# Patient Record
Sex: Female | Born: 2000 | Race: Black or African American | Hispanic: No | Marital: Single | Smoking: Never smoker
Health system: Southern US, Community
[De-identification: ages and names within clinical notes are randomized; demographics above are authoritative.]

## PROBLEM LIST (undated history)

## (undated) DIAGNOSIS — R011 Cardiac murmur, unspecified: Secondary | ICD-10-CM

## (undated) HISTORY — PX: WISDOM TOOTH EXTRACTION: SHX21

## (undated) HISTORY — PX: NO PAST SURGERIES: SHX2092

---
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
## 2020-02-09 ENCOUNTER — Emergency Department (HOSPITAL_COMMUNITY)
Admission: EM | Admit: 2020-02-09 | Discharge: 2020-02-09 | Disposition: A | Discharge status: Home / Self Care | Payer: Medicaid Other | Attending: Emergency Medicine | Admitting: Emergency Medicine

## 2020-02-09 ENCOUNTER — Encounter (HOSPITAL_COMMUNITY): Payer: Self-pay | Admitting: Emergency Medicine

## 2020-02-09 ENCOUNTER — Other Ambulatory Visit: Payer: Self-pay

## 2020-02-09 DIAGNOSIS — J029 Acute pharyngitis, unspecified: Secondary | ICD-10-CM | POA: Insufficient documentation

## 2020-02-09 DIAGNOSIS — Z79899 Other long term (current) drug therapy: Secondary | ICD-10-CM | POA: Diagnosis not present

## 2020-02-09 LAB — GROUP A STREP BY PCR: Group A Strep by PCR: NOT DETECTED

## 2020-02-09 MED ORDER — CHLORHEXIDINE GLUCONATE 0.12 % MT SOLN: 15.0000 mL | Freq: Two times a day (BID) | OROMUCOSAL | 0 refills | Status: DC

## 2020-02-09 MED ORDER — IBUPROFEN 600 MG PO TABS: 600.0000 mg | ORAL_TABLET | Freq: Four times a day (QID) | ORAL | 0 refills | Status: DC | PRN

## 2020-02-09 NOTE — ED Triage Notes (Signed)
Pt c/o sore throat since this morning. Denies fever.

## 2020-02-09 NOTE — Discharge Instructions (Signed)
Your strep test is negative.  You are likely having a viral pharyngitis. Try using a cool-mist vaporizer/humidifer/steam from hot showers, limit talking, OTC throat lozenges and mouthwashes qd, gargling w/ warm saltwater, advancement of fluids as tolerated, nasal saline sprays, rest, OTC acetaminophen or ibuprofen as directed prn for pain control, frequent handwashing, and boiling/disposing of contaminated toothbrushes.

## 2020-02-09 NOTE — ED Notes (Signed)
Pt verbalized understanding of d/c instructions, follow up care, s/s requiring return to ED and prescriptions. Pt had no further questions at this time.

## 2020-02-09 NOTE — ED Provider Notes (Signed)
[facility] EMERGENCY DEPARTMENT Provider Note   CSN: [id number] Arrival date & time: 02/09/20  1854     History Chief Complaint  Patient presents with  . Sore Throat    Rebecca Bean is a 19 y.o. female.  The history is provided by the patient. No language interpreter was used.     19 year old female presenting for evaluation of sore throat.  Patient reports since this morning she has been experiencing throat irritation.  She endorsed increasing pain with swallowing.  Symptom is mild to moderate in severity.  No associated fever chills no runny nose sneezing coughing chest pain shortness of breath abdominal cramping loss of taste or loss of smell.  No recent Covid exposure or sick contact.  No specific treatment tried.  Denies any significant neck pain.  Past Medical History:  Diagnosis Date  . Heart murmur     There are no problems to display for this patient.   History reviewed. No pertinent surgical history.   OB History    Gravida  1   Para  0   Term  0   Preterm  0   AB  0   Living        SAB  0   TAB  0   Ectopic  0   Multiple      Live Births              No family history on file.  Social History   Tobacco Use  . Smoking status: Never Smoker  . Smokeless tobacco: Never Used  Substance Use Topics  . Alcohol use: No  . Drug use: No    Home Medications Prior to Admission medications   Medication Sig Start Date End Date Taking? Authorizing Provider  diphenhydrAMINE (BENADRYL) 25 mg capsule Take 1 capsule (25 mg total) by mouth every 6 (six) hours as needed for itching. 03/04/17   Gwyneth Sprout, MD  hydrocortisone 1 % ointment Apply 1 application topically 2 (two) times daily. 12/14/15   Charlynne Pander, MD  ibuprofen (ADVIL,MOTRIN) 600 MG tablet Take 1 tablet (600 mg total) by mouth every 6 (six) hours as needed. 08/14/14   Ree Shay, MD    Allergies    Patient has no known allergies.  Review of Systems    Review of Systems  Constitutional: Negative for fever.  HENT: Positive for sore throat. Negative for dental problem, rhinorrhea and trouble swallowing.   Respiratory: Negative for shortness of breath.     Physical Exam Updated Vital Signs BP 125/76 (BP Location: Left Arm)   Pulse (!) 102   Temp 98.9 F (37.2 C) (Oral)   Resp 17   SpO2 100%   Physical Exam Vitals and nursing note reviewed.  Constitutional:      General: She is not in acute distress.    Appearance: She is well-developed.     Comments: Well-appearing, normal phonation, no drooling  HENT:     Head: Atraumatic.     Right Ear: Tympanic membrane normal.     Left Ear: Tympanic membrane normal.     Mouth/Throat:     Pharynx: Uvula midline. No uvula swelling.     Tonsils: Tonsillar exudate present. No tonsillar abscesses. 2+ on the right. 2+ on the left.  Eyes:     Conjunctiva/sclera: Conjunctivae normal.  Cardiovascular:     Rate and Rhythm: Normal rate and regular rhythm.  Pulmonary:     Effort: Pulmonary effort is normal.  Breath sounds: Normal breath sounds. No wheezing.  Abdominal:     Palpations: Abdomen is soft.     Tenderness: There is no abdominal tenderness.  Musculoskeletal:     Cervical back: Neck supple.  Skin:    Findings: No rash.  Neurological:     Mental Status: She is alert.     ED Results / Procedures / Treatments   Labs (all labs ordered are listed, but only abnormal results are displayed) Labs Reviewed  GROUP A STREP BY PCR    EKG None  Radiology No results found.  Procedures Procedures (including critical care time)  Medications Ordered in ED Medications - No data to display  ED Course  I have reviewed the triage vital signs and the nursing notes.  Pertinent labs & imaging results that were available during my care of the patient were reviewed by me and considered in my medical decision making (see chart for details).    MDM Rules/Calculators/A&P                       BP 125/76 (BP Location: Left Arm)   Pulse (!) 102   Temp 98.9 F (37.2 C) (Oral)   Resp 17   SpO2 100%   Final Clinical Impression(s) / ED Diagnoses Final diagnoses:  Viral pharyngitis    Rx / DC Orders ED Discharge Orders         Ordered    chlorhexidine (PERIDEX) 0.12 % solution  2 times daily     02/09/20 2112    ibuprofen (ADVIL) 600 MG tablet  Every 6 hours PRN     02/09/20 2112         Patient presenting with sore throat consistent with viral pharyngitis.  1 out of 4 Centor criteria.  The patient did not have trismus, hot potato voice, uvula deviation, unilateral tonsillar swelling, toxic appearance, drooling or pain with movement of the trachea to suggest peritonsillar abscess or epiglottitis.  No evidence of bacterial infections including peritonsillar abscess, retropharyngeal abscess, epiglottitis.  Rapid strep was obtained and was negative decreasing the likelihood of bacterial pharyngitis (A rapid strep test has a 95% sensitivity and a specificity of 98%).  Patient advised to continue ibuprofen and Tylenol at home.  Impression: Viral Pharyngitis Sore Throat  Advised Pt on supportive therapies, including using a cool-mist vaporizer/humidifer/steam from hot showers, limit talking, OTC throat lozenges and mouthwashes qd, gargling w/ warm saltwater, advancement of fluids as tolerated, nasal saline sprays, rest, OTC acetaminophen or ibuprofen as directed prn for pain control, frequent handwashing, and boiling/disposing of contaminated toothbrushes.   * Instructed Pt to f/up w/ PCP or ER should Sx worsen or not improve. Pt verbally expressed understanding and all questions were addressed to Pt's satisfaction.   * Informed to return to ER if has new or worsening symptoms.    Domenic Moras, PA-C 02/09/20 2113    Carmin Muskrat, MD 02/10/20 2350

## 2020-02-10 ENCOUNTER — Ambulatory Visit (INDEPENDENT_AMBULATORY_CARE_PROVIDER_SITE_OTHER): Payer: Medicaid Other | Admitting: *Deleted

## 2020-02-10 DIAGNOSIS — Z3200 Encounter for pregnancy test, result unknown: Secondary | ICD-10-CM

## 2020-02-10 DIAGNOSIS — Z98891 History of uterine scar from previous surgery: Secondary | ICD-10-CM | POA: Insufficient documentation

## 2020-02-10 DIAGNOSIS — Z3401 Encounter for supervision of normal first pregnancy, first trimester: Secondary | ICD-10-CM

## 2020-02-10 DIAGNOSIS — Z3201 Encounter for pregnancy test, result positive: Secondary | ICD-10-CM | POA: Diagnosis not present

## 2020-02-10 DIAGNOSIS — Z3400 Encounter for supervision of normal first pregnancy, unspecified trimester: Secondary | ICD-10-CM | POA: Insufficient documentation

## 2020-02-10 LAB — POCT URINE PREGNANCY: Preg Test, Ur: POSITIVE — AB

## 2020-02-10 NOTE — Progress Notes (Signed)
Rebecca Bean presents today for UPT. She has no unusual complaints.  LMP:12/17/2019 EDD: 09/22/2020    OBJECTIVE: Appears well, in no apparent distress.  OB History    Gravida  1   Para  0   Term  0   Preterm  0   AB  0   Living        SAB  0   TAB  0   Ectopic  0   Multiple      Live Births             Home UPT Result: Positive  In-Office UPT result:Positive  I have reviewed the patient's medical, obstetrical, social, and family histories, and medications.   ASSESSMENT: Positive pregnancy test  PLAN Prenatal care to be completed at: [facility] NOB intake completed today.

## 2020-02-10 NOTE — Progress Notes (Signed)
Patient seen and assessed by nursing staff during this encounter. I have reviewed the chart and agree with the documentation and plan.  Jaynie Collins, MD 02/10/2020 9:15 AM

## 2020-03-09 ENCOUNTER — Other Ambulatory Visit: Payer: Self-pay

## 2020-03-09 MED ORDER — BLOOD PRESSURE KIT DEVI: 1.0000 | 0 refills | Status: DC

## 2020-03-10 ENCOUNTER — Other Ambulatory Visit: Payer: Self-pay

## 2020-03-10 ENCOUNTER — Encounter: Payer: Self-pay | Admitting: Nurse Practitioner

## 2020-03-10 ENCOUNTER — Ambulatory Visit (INDEPENDENT_AMBULATORY_CARE_PROVIDER_SITE_OTHER): Payer: Medicaid Other | Admitting: Nurse Practitioner

## 2020-03-10 ENCOUNTER — Other Ambulatory Visit (HOSPITAL_COMMUNITY)
Admission: RE | Admit: 2020-03-10 | Discharge: 2020-03-10 | Disposition: A | Discharge status: Home / Self Care | Payer: Medicaid Other | Source: Ambulatory Visit | Attending: Nurse Practitioner | Admitting: Nurse Practitioner

## 2020-03-10 VITALS — BP 116/70 | HR 84 | Wt 172.6 lb

## 2020-03-10 DIAGNOSIS — Z3401 Encounter for supervision of normal first pregnancy, first trimester: Secondary | ICD-10-CM

## 2020-03-10 DIAGNOSIS — Z3A12 12 weeks gestation of pregnancy: Secondary | ICD-10-CM

## 2020-03-10 DIAGNOSIS — Z3400 Encounter for supervision of normal first pregnancy, unspecified trimester: Secondary | ICD-10-CM | POA: Diagnosis not present

## 2020-03-10 DIAGNOSIS — O210 Mild hyperemesis gravidarum: Secondary | ICD-10-CM

## 2020-03-10 MED ORDER — DOXYLAMINE-PYRIDOXINE 10-10 MG PO TBEC: DELAYED_RELEASE_TABLET | ORAL | 2 refills | Status: DC

## 2020-03-10 NOTE — Patient Instructions (Addendum)
Morning Sickness  Morning sickness is when you feel sick to your stomach (nauseous) during pregnancy. You may feel sick to your stomach and throw up (vomit). You may feel sick in the morning, but you can feel this way at any time of day. Some women feel very sick to their stomach and cannot stop throwing up (hyperemesis gravidarum). Follow these instructions at home: Medicines  Take over-the-counter and prescription medicines only as told by your doctor. Do not take any medicines until you talk with your doctor about them first.  Taking multivitamins before getting pregnant can stop or lessen the harshness of morning sickness. Eating and drinking  Eat dry toast or crackers before getting out of bed.  Eat 5 or 6 small meals a day.  Eat dry and bland foods like rice and baked potatoes.  Do not eat greasy, fatty, or spicy foods.  Have someone cook for you if the smell of food causes you to feel sick or throw up.  If you feel sick to your stomach after taking prenatal vitamins, take them at night or with a snack.  Eat protein when you need a snack. Nuts, yogurt, and cheese are good choices.  Drink fluids throughout the day.  Try ginger ale made with real ginger, ginger tea made from fresh grated ginger, or ginger candies. General instructions  Do not use any products that have nicotine or tobacco in them, such as cigarettes and e-cigarettes. If you need help quitting, ask your doctor.  Use an air purifier to keep the air in your house free of smells.  Get lots of fresh air.  Try to avoid smells that make you feel sick.  Try: ? Wearing a bracelet that is used for seasickness (acupressure wristband). ? Going to a doctor who puts thin needles into certain body points (acupuncture) to improve how you feel. Contact a doctor if:  You need medicine to feel better.  You feel dizzy or light-headed.  You are losing weight. Get help right away if:  You feel very sick to your  stomach and cannot stop throwing up.  You pass out (faint).  You have very bad pain in your belly. Summary  Morning sickness is when you feel sick to your stomach (nauseous) during pregnancy.  You may feel sick in the morning, but you can feel this way at any time of day.  Making some changes to what you eat may help your symptoms go away. This information is not intended to replace advice given to you by your health care provider. Make sure you discuss any questions you have with your health care provider. Document Revised: 10/20/2017 Document Reviewed: 12/08/2016 Elsevier Patient Education  2020 [employer] First Trimester of Pregnancy  The first trimester of pregnancy is from week 1 until the end of week 13 (months 1 through 3). During this time, your baby will begin to develop inside you. At 6-8 weeks, the eyes and face are formed, and the heartbeat can be seen on ultrasound. At the end of 12 weeks, all the baby's organs are formed. Prenatal care is all the medical care you receive before the birth of your baby. Make sure you get good prenatal care and follow all of your doctor's instructions. Follow these instructions at home: Medicines  Take over-the-counter and prescription medicines only as told by your doctor. Some medicines are safe and some medicines are not safe during pregnancy.  Take a prenatal vitamin that contains at least 600 micrograms (mcg) of   folic acid.  If you have trouble pooping (constipation), take medicine that will make your stool soft (stool softener) if your doctor approves. Eating and drinking   Eat regular, healthy meals.  Your doctor will tell you the amount of weight gain that is right for you.  Avoid raw meat and uncooked cheese.  If you feel sick to your stomach (nauseous) or throw up (vomit): ? Eat 4 or 5 small meals a day instead of 3 large meals. ? Try eating a few soda crackers. ? Drink liquids between meals instead of during  meals.  To prevent constipation: ? Eat foods that are high in fiber, like fresh fruits and vegetables, whole grains, and beans. ? Drink enough fluids to keep your pee (urine) clear or pale yellow. Activity  Exercise only as told by your doctor. Stop exercising if you have cramps or pain in your lower belly (abdomen) or low back.  Do not exercise if it is too hot, too humid, or if you are in a place of great height (high altitude).  Try to avoid standing for long periods of time. Move your legs often if you must stand in one place for a long time.  Avoid heavy lifting.  Wear low-heeled shoes. Sit and stand up straight.  You can have sex unless your doctor tells you not to. Relieving pain and discomfort  Wear a good support bra if your breasts are sore.  Take warm water baths (sitz baths) to soothe pain or discomfort caused by hemorrhoids. Use hemorrhoid cream if your doctor says it is okay.  Rest with your legs raised if you have leg cramps or low back pain.  If you have puffy, bulging veins (varicose veins) in your legs: ? Wear support hose or compression stockings as told by your doctor. ? Raise (elevate) your feet for 15 minutes, 3-4 times a day. ? Limit salt in your food. Prenatal care  Schedule your prenatal visits by the twelfth week of pregnancy.  Write down your questions. Take them to your prenatal visits.  Keep all your prenatal visits as told by your doctor. This is important. Safety  Wear your seat belt at all times when driving.  Make a list of emergency phone numbers. The list should include numbers for family, friends, the hospital, and police and fire departments. General instructions  Ask your doctor for a referral to a local prenatal class. Begin classes no later than at the start of month 6 of your pregnancy.  Ask for help if you need counseling or if you need help with nutrition. Your doctor can give you advice or tell you where to go for help.  Do  not use hot tubs, steam rooms, or saunas.  Do not douche or use tampons or scented sanitary pads.  Do not cross your legs for long periods of time.  Avoid all herbs and alcohol. Avoid drugs that are not approved by your doctor.  Do not use any tobacco products, including cigarettes, chewing tobacco, and electronic cigarettes. If you need help quitting, ask your doctor. You may get counseling or other support to help you quit.  Avoid cat litter boxes and soil used by cats. These carry germs that can cause birth defects in the baby and can cause a loss of your baby (miscarriage) or stillbirth.  Visit your dentist. At home, brush your teeth with a soft toothbrush. Be gentle when you floss. Contact a doctor if:  You are dizzy.  You have mild   cramps or pressure in your lower belly.  You have a nagging pain in your belly area.  You continue to feel sick to your stomach, you throw up, or you have watery poop (diarrhea).  You have a bad smelling fluid coming from your vagina.  You have pain when you pee (urinate).  You have increased puffiness (swelling) in your face, hands, legs, or ankles. Get help right away if:  You have a fever.  You are leaking fluid from your vagina.  You have spotting or bleeding from your vagina.  You have very bad belly cramping or pain.  You gain or lose weight rapidly.  You throw up blood. It may look like coffee grounds.  You are around people who have German measles, fifth disease, or chickenpox.  You have a very bad headache.  You have shortness of breath.  You have any kind of trauma, such as from a fall or a car accident. Summary  The first trimester of pregnancy is from week 1 until the end of week 13 (months 1 through 3).  To take care of yourself and your unborn baby, you will need to eat healthy meals, take medicines only if your doctor tells you to do so, and do activities that are safe for you and your baby.  Keep all follow-up  visits as told by your doctor. This is important as your doctor will have to ensure that your baby is healthy and growing well. This information is not intended to replace advice given to you by your health care provider. Make sure you discuss any questions you have with your health care provider. Document Revised: 02/28/2019 Document Reviewed: 11/15/2016 Elsevier Patient Education  2020 [employer]  

## 2020-03-10 NOTE — Progress Notes (Signed)
Pt presents for NOB visit c/o morning sickness. Babyscripts explained in detail

## 2020-03-10 NOTE — Progress Notes (Signed)
Subjective:   Rebecca Bean is a 19 y.o. G1P0000 at 77w5dby LMP being seen today for her first obstetrical visit.  Her obstetrical history is significant for age 19 nausea and vomiting this pregnancy. Patient does intend to breast feed. Pregnancy history fully reviewed.  Patient reports no complaints.  HISTORY: OB History  Gravida Para Term Preterm AB Living  1 0 0 0 0 0  SAB TAB Ectopic Multiple Live Births  0 0 0 0 0    # Outcome Date GA Lbr Len/2nd Weight Sex Delivery Anes PTL Lv  1 Current            Past Medical History:  Diagnosis Date  . Heart murmur    No past surgical history on file. Family History  Problem Relation Age of Onset  . Hypertension Maternal Grandmother    Social History   Tobacco Use  . Smoking status: Former SResearch scientist (life sciences) . Smokeless tobacco: Never Used  Substance Use Topics  . Alcohol use: No  . Drug use: No   No Known Allergies Current Outpatient Medications on File Prior to Visit  Medication Sig Dispense Refill  . Blood Pressure Monitoring (BLOOD PRESSURE KIT) DEVI 1 kit by Does not apply route once a week. Check Blood Pressure regularly and record readings into the Babyscripts App.  Large Cuff.  DX O90.0 1 each 0  . chlorhexidine (PERIDEX) 0.12 % solution Use as directed 15 mLs in the mouth or throat 2 (two) times daily. 120 mL 0  . hydrocortisone 1 % ointment Apply 1 application topically 2 (two) times daily. 30 g 0   No current facility-administered medications on file prior to visit.     Exam   Vitals:   03/10/20 1528  BP: 116/70  Pulse: 84  Weight: 172 lb 9.6 oz (78.3 kg)   Fetal Heart Rate (bpm): 164  Uterus:     Pelvic Exam: Perineum: no hemorrhoids, normal perineum   Vulva: normal external genitalia, no lesions   Vagina:  normal mucosa, normal discharge   Cervix: no lesions and normal, pap smear done.    Adnexa: normal adnexa and no mass, fullness, tenderness   Bony Pelvis: average  System: General: well-developed,  well-nourished female in no acute distress   Breast:  normal appearance, no masses or tenderness   Skin: normal coloration and turgor, no rashes   Neurologic: oriented, normal, negative, normal mood   Extremities: normal strength, tone, and muscle mass, ROM of all joints is normal   HEENT extraocular movement intact and sclera clear, anicteric   Mouth/Teeth mucous membranes moist, pharynx normal without lesions and dental hygiene good   Neck supple and no masses, normal thyroid   Cardiovascular: regular rate and rhythm   Respiratory:  no respiratory distress, normal breath sounds   Abdomen: soft, non-tender; no masses,  no organomegaly     Assessment:   Pregnancy: G1P0000 Patient Active Problem List   Diagnosis Date Noted  . Morning sickness 03/10/2020  . Supervision of normal first pregnancy 02/10/2020     Plan:  1. Supervision of normal first pregnancy, antepartum Revewed Babyscripts and MyChart To pick up BP cuff Reviewed to call the office and be seen by this practice for any problems in the pregnancy.  - Enroll Patient in Babyscripts - Babyscripts Schedule Optimization - Culture, OB Urine - Genetic Screening - Hepatitis C Antibody - Obstetric Panel, Including HIV - Cervicovaginal ancillary only( [place]) - [place]FM OB COMP + 14  WK; Future  2. Morning sickness Diclegis ordered and reviewed how to begin and take the medication.   Initial labs drawn. Continue prenatal vitamins. Genetic Screening discussed, NIPS: ordered. Ultrasound discussed; fetal anatomic survey: ordered. Problem list reviewed and updated. The nature of [place] [facility] with multiple MDs and other Advanced Practice Providers was explained to patient; also emphasized that residents, students are part of our team. Routine obstetric precautions reviewed. Return in about 4 weeks (around 04/07/2020) for virtual ROB.  Total face-to-face time with patient: 40 minutes.   Over 50% of encounter was spent on counseling and coordination of care.     Earlie Server, FNP Family Nurse Practitioner, [facility] for [employer], [place] [facility] 03/10/2020 4:07 PM

## 2020-03-11 ENCOUNTER — Encounter: Payer: Self-pay | Admitting: Nurse Practitioner

## 2020-03-11 DIAGNOSIS — A568 Sexually transmitted chlamydial infection of other sites: Secondary | ICD-10-CM | POA: Insufficient documentation

## 2020-03-11 DIAGNOSIS — O98211 Gonorrhea complicating pregnancy, first trimester: Secondary | ICD-10-CM | POA: Insufficient documentation

## 2020-03-11 LAB — CERVICOVAGINAL ANCILLARY ONLY
Bacterial Vaginitis (gardnerella): POSITIVE — AB
Candida Glabrata: NEGATIVE
Candida Vaginitis: POSITIVE — AB
Chlamydia: POSITIVE — AB
Comment: NEGATIVE
Comment: NEGATIVE
Comment: NEGATIVE
Comment: NEGATIVE
Comment: NEGATIVE
Comment: NORMAL
Neisseria Gonorrhea: POSITIVE — AB
Trichomonas: NEGATIVE

## 2020-03-11 LAB — OBSTETRIC PANEL, INCLUDING HIV
Antibody Screen: NEGATIVE
Basophils Absolute: 0 10*3/uL (ref 0.0–0.2)
Basos: 0 %
EOS (ABSOLUTE): 0.2 10*3/uL (ref 0.0–0.4)
Eos: 2 %
HIV Screen 4th Generation wRfx: NONREACTIVE
Hematocrit: 34.8 % (ref 34.0–46.6)
Hemoglobin: 11.1 g/dL (ref 11.1–15.9)
Hepatitis B Surface Ag: NEGATIVE
Immature Grans (Abs): 0 10*3/uL (ref 0.0–0.1)
Immature Granulocytes: 0 %
Lymphocytes Absolute: 2.9 10*3/uL (ref 0.7–3.1)
Lymphs: 27 %
MCH: 24.3 pg — ABNORMAL LOW (ref 26.6–33.0)
MCHC: 31.9 g/dL (ref 31.5–35.7)
MCV: 76 fL — ABNORMAL LOW (ref 79–97)
Monocytes Absolute: 0.9 10*3/uL (ref 0.1–0.9)
Monocytes: 8 %
Neutrophils Absolute: 6.7 10*3/uL (ref 1.4–7.0)
Neutrophils: 63 %
Platelets: 266 10*3/uL (ref 150–450)
RBC: 4.56 x10E6/uL (ref 3.77–5.28)
RDW: 15.4 % (ref 11.7–15.4)
RPR Ser Ql: NONREACTIVE
Rh Factor: POSITIVE
Rubella Antibodies, IGG: 11.1 index (ref >0.99)
WBC: 10.8 10*3/uL (ref 3.4–10.8)

## 2020-03-11 LAB — HEPATITIS C ANTIBODY: Hep C Virus Ab: <0.1 s/co ratio (ref 0.0–0.9)

## 2020-03-12 ENCOUNTER — Other Ambulatory Visit: Payer: Self-pay

## 2020-03-12 ENCOUNTER — Ambulatory Visit (INDEPENDENT_AMBULATORY_CARE_PROVIDER_SITE_OTHER): Payer: Medicaid Other

## 2020-03-12 VITALS — BP 115/70 | HR 89 | Wt 173.0 lb

## 2020-03-12 DIAGNOSIS — Z3A00 Weeks of gestation of pregnancy not specified: Secondary | ICD-10-CM

## 2020-03-12 DIAGNOSIS — O98311 Other infections with a predominantly sexual mode of transmission complicating pregnancy, first trimester: Secondary | ICD-10-CM | POA: Diagnosis not present

## 2020-03-12 DIAGNOSIS — A568 Sexually transmitted chlamydial infection of other sites: Secondary | ICD-10-CM | POA: Diagnosis not present

## 2020-03-12 DIAGNOSIS — O98211 Gonorrhea complicating pregnancy, first trimester: Secondary | ICD-10-CM

## 2020-03-12 MED ORDER — CEFTRIAXONE SODIUM 1 G IJ SOLR
1.0000 g | Freq: Once | INTRAMUSCULAR | Status: AC
  Administered 2020-03-12: 1 g via INTRAMUSCULAR

## 2020-03-12 MED ORDER — AZITHROMYCIN 500 MG PO TABS: 1000.0000 mg | ORAL_TABLET | Freq: Once | ORAL | 1 refills | Status: AC

## 2020-03-12 NOTE — Progress Notes (Signed)
Patient presents for Rocephin inj for positive GC. Inj given in RUOQ. Pt tolerated well. Advised patient to abstain from IC until she and her partner has completed treatment. Rx for Chlamydia has been sent to the pharmacy.  Ceftriaxone reconstituted with 2.1 ML 1% Lidocaine

## 2020-03-15 ENCOUNTER — Encounter: Payer: Self-pay | Admitting: Nurse Practitioner

## 2020-03-15 DIAGNOSIS — O99820 Streptococcus B carrier state complicating pregnancy: Secondary | ICD-10-CM | POA: Insufficient documentation

## 2020-03-15 LAB — URINE CULTURE, OB REFLEX

## 2020-03-15 LAB — CULTURE, OB URINE

## 2020-03-23 ENCOUNTER — Encounter: Payer: Self-pay | Admitting: Nurse Practitioner

## 2020-03-23 DIAGNOSIS — D563 Thalassemia minor: Secondary | ICD-10-CM | POA: Insufficient documentation

## 2020-03-24 ENCOUNTER — Other Ambulatory Visit: Payer: Medicaid Other

## 2020-03-24 ENCOUNTER — Other Ambulatory Visit: Payer: Self-pay

## 2020-03-26 ENCOUNTER — Encounter: Payer: Self-pay | Admitting: Nurse Practitioner

## 2020-04-01 ENCOUNTER — Encounter: Payer: Self-pay | Admitting: Obstetrics and Gynecology

## 2020-04-07 ENCOUNTER — Encounter: Payer: Self-pay | Admitting: Obstetrics

## 2020-04-07 ENCOUNTER — Telehealth (INDEPENDENT_AMBULATORY_CARE_PROVIDER_SITE_OTHER): Payer: Medicaid Other | Admitting: Obstetrics

## 2020-04-07 DIAGNOSIS — O99820 Streptococcus B carrier state complicating pregnancy: Secondary | ICD-10-CM

## 2020-04-07 DIAGNOSIS — Z3400 Encounter for supervision of normal first pregnancy, unspecified trimester: Secondary | ICD-10-CM

## 2020-04-07 DIAGNOSIS — O98212 Gonorrhea complicating pregnancy, second trimester: Secondary | ICD-10-CM

## 2020-04-07 DIAGNOSIS — O210 Mild hyperemesis gravidarum: Secondary | ICD-10-CM

## 2020-04-07 DIAGNOSIS — Z3A16 16 weeks gestation of pregnancy: Secondary | ICD-10-CM

## 2020-04-07 NOTE — Progress Notes (Signed)
   OBSTETRICS PRENATAL VIRTUAL VISIT ENCOUNTER NOTE  Provider location: Center for [facility] [place]   I connected with [name] on 04/07/20 at 11:15 AM EDT by MyChart Video Encounter at home and verified that I am speaking with the correct person using two identifiers.   I discussed the limitations, risks, security and privacy concerns of performing an evaluation and management service virtually and the availability of in person appointments. I also discussed with the patient that there may be a patient responsible charge related to this service. The patient expressed understanding and agreed to proceed. Subjective:  Rebecca Bean is a 19 y.o. G1P0000 at [redacted]w[redacted]d being seen today for ongoing prenatal care.  She is currently monitored for the following issues for this low-risk pregnancy and has Supervision of normal first pregnancy; Morning sickness; Gonorrhea in pregnancy, antepartum, first trimester; Chlamydia trachomatis infection in pregnancy in first trimester; Group B Streptococcus carrier, +RV culture, currently pregnant; and Alpha thalassemia silent carrier on their problem list.  Patient reports nausea.   .  .   . Denies any leaking of fluid.   The following portions of the patient's history were reviewed and updated as appropriate: allergies, current medications, past family history, past medical history, past social history, past surgical history and problem list.   Objective:  There were no vitals filed for this visit.  Fetal Status:           General:  Alert, oriented and cooperative. Patient is in no acute distress.  Respiratory: Normal respiratory effort, no problems with respiration noted  Mental Status: Normal mood and affect. Normal behavior. Normal judgment and thought content.  Rest of physical exam deferred due to type of encounter  Imaging: No results found.  Assessment and Plan:  Pregnancy: G1P0000 at [redacted]w[redacted]d 1. Supervision of normal first  pregnancy, antepartum  2. Morning sickness - Diclegis Rx along with dietary modifications   Preterm labor symptoms and general obstetric precautions including but not limited to vaginal bleeding, contractions, leaking of fluid and fetal movement were reviewed in detail with the patient. I discussed the assessment and treatment plan with the patient. The patient was provided an opportunity to ask questions and all were answered. The patient agreed with the plan and demonstrated an understanding of the instructions. The patient was advised to call back or seek an in-person office evaluation/go to [facility] at [facility] for any urgent or concerning symptoms. Please refer to After Visit Summary for other counseling recommendations.   I provided 10 minutes of face-to-face time during this encounter.  Return in about 4 weeks (around 05/05/2020) for MyChart.  Future Appointments  Date Time Provider Department Center  04/23/2020 10:45 AM [facility] US5 [facility]    Coral Ceo, MD Center for [facility], [facility] 04/07/2020

## 2020-04-22 ENCOUNTER — Encounter: Payer: Self-pay | Admitting: Nurse Practitioner

## 2020-04-23 ENCOUNTER — Other Ambulatory Visit: Payer: Self-pay | Admitting: Nurse Practitioner

## 2020-04-23 ENCOUNTER — Ambulatory Visit: Payer: Medicaid Other | Attending: Nurse Practitioner

## 2020-04-23 ENCOUNTER — Other Ambulatory Visit: Payer: Self-pay

## 2020-04-23 ENCOUNTER — Other Ambulatory Visit: Payer: Self-pay | Admitting: *Deleted

## 2020-04-23 DIAGNOSIS — Z3A16 16 weeks gestation of pregnancy: Secondary | ICD-10-CM | POA: Diagnosis not present

## 2020-04-23 DIAGNOSIS — Z3400 Encounter for supervision of normal first pregnancy, unspecified trimester: Secondary | ICD-10-CM

## 2020-04-23 DIAGNOSIS — Z363 Encounter for antenatal screening for malformations: Secondary | ICD-10-CM | POA: Diagnosis not present

## 2020-04-23 DIAGNOSIS — Z148 Genetic carrier of other disease: Secondary | ICD-10-CM | POA: Diagnosis not present

## 2020-04-23 DIAGNOSIS — O2692 Pregnancy related conditions, unspecified, second trimester: Secondary | ICD-10-CM

## 2020-04-23 DIAGNOSIS — Z362 Encounter for other antenatal screening follow-up: Secondary | ICD-10-CM

## 2020-05-05 ENCOUNTER — Telehealth (INDEPENDENT_AMBULATORY_CARE_PROVIDER_SITE_OTHER): Payer: Medicaid Other | Admitting: Certified Nurse Midwife

## 2020-05-05 DIAGNOSIS — Z3402 Encounter for supervision of normal first pregnancy, second trimester: Secondary | ICD-10-CM

## 2020-05-05 DIAGNOSIS — O98211 Gonorrhea complicating pregnancy, first trimester: Secondary | ICD-10-CM

## 2020-05-05 DIAGNOSIS — O98311 Other infections with a predominantly sexual mode of transmission complicating pregnancy, first trimester: Secondary | ICD-10-CM

## 2020-05-05 DIAGNOSIS — Z3A17 17 weeks gestation of pregnancy: Secondary | ICD-10-CM

## 2020-05-05 DIAGNOSIS — A568 Sexually transmitted chlamydial infection of other sites: Secondary | ICD-10-CM

## 2020-05-05 MED ORDER — VITAFOL GUMMIES 3.33-0.333-34.8 MG PO CHEW
3.0000 | CHEWABLE_TABLET | Freq: Every day | ORAL | 3 refills | Status: DC
Start: 2020-05-05 — End: 2020-10-18

## 2020-05-05 NOTE — Progress Notes (Signed)
Pt states she is having some occ cramping- would like to discuss.  Pt denies bleeding.   Pt needs PNV- please in Rx.   Pt is unable to check BP at time of intake - pt is driving.

## 2020-05-05 NOTE — Patient Instructions (Addendum)
Eating Plan for Pregnant Women While you are pregnant, your body requires additional nutrition to help support your growing baby. You also have a higher need for some vitamins and minerals, such as folic acid, calcium, iron, and vitamin D. Eating a healthy, well-balanced diet is very important for your health and your baby's health. Your need for extra calories varies for the three 80-monthsegments of your pregnancy (trimesters). For most women, it is recommended to consume:  150 extra calories a day during the first trimester.  300 extra calories a day during the second trimester.  300 extra calories a day during the third trimester. What are tips for following this plan?   Do not try to lose weight or go on a diet during pregnancy.  Limit your overall intake of foods that have "empty calories." These are foods that have little nutritional value, such as sweets, desserts, candies, and sugar-sweetened beverages.  Eat a variety of foods (especially fruits and vegetables) to get a full range of vitamins and minerals.  Take a prenatal vitamin to help meet your additional vitamin and mineral needs during pregnancy, specifically for folic acid, iron, calcium, and vitamin D.  Remember to stay active. Ask your health care provider what types of exercise and activities are safe for you.  Practice good food safety and cleanliness. Wash your hands before you eat and after you prepare raw meat. Wash all fruits and vegetables well before peeling or eating. Taking these actions can help to prevent food-borne illnesses that can be very dangerous to your baby, such as listeriosis. Ask your health care provider for more information about listeriosis. What does 150 extra calories look like? Healthy options that provide 150 extra calories each day could be any of the following:  6-8 oz (170-230 g) of plain low-fat yogurt with  cup of berries.  1 apple with 2 teaspoons (11 g) of peanut butter.  Cut-up  vegetables with  cup (60 g) of hummus.  8 oz (230 mL) or 1 cup of low-fat chocolate milk.  1 stick of string cheese with 1 medium orange.  1 peanut butter and jelly sandwich that is made with one slice of whole-wheat bread and 1 tsp (5 g) of peanut butter. For 300 extra calories, you could eat two of those healthy options each day. What is a healthy amount of weight to gain? The right amount of weight gain for you is based on your BMI before you became pregnant. If your BMI:  Was less than 18 (underweight), you should gain 28-40 lb (13-18 kg).  Was 18-24.9 (normal), you should gain 25-35 lb (11-16 kg).  Was 25-29.9 (overweight), you should gain 15-25 lb (7-11 kg).  Was 30 or greater (obese), you should gain 11-20 lb (5-9 kg). What if I am having twins or multiples? Generally, if you are carrying twins or multiples:  You may need to eat 300-600 extra calories a day.  The recommended range for total weight gain is 25-54 lb (11-25 kg), depending on your BMI before pregnancy.  Talk with your health care provider to find out about nutritional needs, weight gain, and exercise that is right for you. What foods can I eat?  Fruits All fruits. Eat a variety of colors and types of fruit. Remember to wash your fruits well before peeling or eating. Vegetables All vegetables. Eat a variety of colors and types of vegetables. Remember to wash your vegetables well before peeling or eating. Grains All grains. Choose whole grains, such  as whole-wheat bread, oatmeal, or brown rice. Meats and other protein foods Lean meats, including chicken, [place], fish, and lean cuts of beef, veal, or pork. If you eat fish or seafood, choose options that are higher in omega-3 fatty acids and lower in mercury, such as salmon, herring, mussels, trout, sardines, pollock, shrimp, crab, and lobster. Tofu. Tempeh. Beans. Eggs. Peanut butter and other nut butters. Make sure that all meats, poultry, and eggs are cooked to  food-safe temperatures or "well-done." Two or more servings of fish are recommended each week in order to get the most benefits from omega-3 fatty acids that are found in seafood. Choose fish that are lower in mercury. You can find more information online:  GuamGaming.ch Dairy Pasteurized milk and milk alternatives (such as almond milk). Pasteurized yogurt and pasteurized cheese. Cottage cheese. Sour cream. Beverages Water. Juices that contain 100% fruit juice or vegetable juice. Caffeine-free teas and decaffeinated coffee. Drinks that contain caffeine are okay to drink, but it is better to avoid caffeine. Keep your total caffeine intake to less than 200 mg each day (which is 12 oz or 355 mL of coffee, tea, or soda) or the limit as told by your health care provider. Fats and oils Fats and oils are okay to include in moderation. Sweets and desserts Sweets and desserts are okay to include in moderation. Seasoning and other foods All pasteurized condiments. The items listed above may not be a complete list of foods and beverages you can eat. Contact a dietitian for more information. What foods are not recommended? Fruits Unpasteurized fruit juices. Vegetables Raw (unpasteurized) vegetable juices. Meats and other protein foods Lunch meats, bologna, hot dogs, or other deli meats. (If you must eat those meats, reheat them until they are steaming hot.) Refrigerated pat, meat spreads from a meat counter, smoked seafood that is found in the refrigerated section of a store. Raw or undercooked meats, poultry, and eggs. Raw fish, such as sushi or sashimi. Fish that have high mercury content, such as tilefish, shark, swordfish, and king mackerel. To learn more about mercury in fish, talk with your health care provider or look for online resources, such as:  [employer]amGaming.ch Dairy Raw (unpasteurized) milk and any foods that have raw milk in them. Soft cheeses, such as feta, queso blanco, queso fresco, Brie,  Camembert cheeses, blue-veined cheeses, and Panela cheese (unless it is made with pasteurized milk, which must be stated on the label). Beverages Alcohol. Sugar-sweetened beverages, such as sodas, teas, or energy drinks. Seasoning and other foods Homemade fermented foods and drinks, such as pickles, sauerkraut, or kombucha drinks. (Store-bought pasteurized versions of these are okay.) Salads that are made in a store or deli, such as ham salad, chicken salad, egg salad, tuna salad, and seafood salad. The items listed above may not be a complete list of foods and beverages you should avoid. Contact a dietitian for more information. Where to find more information To calculate the number of calories you need based on your height, weight, and activity level, you can use an online calculator such as:  MobileTransition.ch To calculate how much weight you should gain during pregnancy, you can use an online pregnancy weight gain calculator such as:  StreamingFood.com.cy Summary  While you are pregnant, your body requires additional nutrition to help support your growing baby.  Eat a variety of foods, especially fruits and vegetables to get a full range of vitamins and minerals.  Practice good food safety and cleanliness. Wash your hands before you eat  and after you prepare raw meat. Wash all fruits and vegetables well before peeling or eating. Taking these actions can help to prevent food-borne illnesses, such as listeriosis, that can be very dangerous to your baby.  Do not eat raw meat or fish. Do not eat fish that have high mercury content, such as tilefish, shark, swordfish, and king mackerel. Do not eat unpasteurized (raw) dairy.  Take a prenatal vitamin to help meet your additional vitamin and mineral needs during pregnancy, specifically for folic acid, iron, calcium, and vitamin D. This information is not intended to replace advice given to you by  your health care provider. Make sure you discuss any questions you have with your health care provider. Document Revised: 03/28/2019 Document Reviewed: 08/04/2017 Elsevier Patient Education  [place] Test Why am I having this test? The alpha-fetoprotein test is most commonly used in pregnant women to help screen for birth defects in their unborn baby. It can be used to screen for birth defects, such as chromosome (DNA) abnormalities, problems with the brain or spinal cord, or problems with the abdominal wall of the unborn baby (fetus). The alpha-fetoprotein test may also be done for men or non-pregnant women to check for certain cancers. What is being tested? This test measures the amount of alpha-fetoprotein (AFP) in your blood. AFP is a protein that is made by the liver. Levels can be detected in the mother's blood during pregnancy, starting at 10 weeks and peaking at 16-18 weeks of the pregnancy. Abnormal levels can sometimes be a sign of a birth defect in the baby. Certain cancers can cause a high level of AFP in men and non-pregnant women. What kind of sample is taken?  A blood sample is required for this test. It is usually collected by inserting a needle into a blood vessel. How are the results reported? Your test results will be reported as values. Your health care provider will compare your results to normal ranges that were established after testing a large group of people (reference values). Reference values may vary among labs and hospitals. For this test, common reference values are:  Adult: Less than 40 ng/mL or less than 40 mcg/L (SI units).  Child younger than 1 year: Less than 30 ng/mL. If you are pregnant, the values may also vary based on how long you have been pregnant. What do the results mean? Results that are above the reference values in pregnant women may indicate the following for the baby:  Neural tube defects, such as abnormalities  of the spinal cord or brain.  Abdominal wall defects.  Multiple pregnancy such as twins.  Fetal distress or fetal death. Results that are above the reference values in men or non-pregnant women may indicate:  Reproductive cancers, such as ovarian or testicular cancer.  Liver cancer.  Liver cell death.  Other types of cancer. Very low levels of AFP in pregnant women may indicate the following for the baby:  Down syndrome.  Fetal death. Talk with your health care provider about what your results mean. Questions to ask your health care provider Ask your health care provider, or the department that is doing the test:  When will my results be ready?  How will I get my results?  What are my treatment options?  What other tests do I need?  What are my next steps? Summary  The alpha-fetoprotein test is done on pregnant women to help screen for birth defects in their unborn baby.  Certain cancers can cause a high level of AFP in men and non-pregnant women.  For this test, a blood sample is usually collected by inserting a needle into a blood vessel.  Talk with your health care provider about what your results mean. This information is not intended to replace advice given to you by your health care provider. Make sure you discuss any questions you have with your health care provider. Document Revised: 10/20/2017 Document Reviewed: 06/13/2017 Elsevier Patient Education  2020 [employer].

## 2020-05-05 NOTE — Progress Notes (Signed)
OBSTETRICS PRENATAL VIRTUAL VISIT ENCOUNTER NOTE  Provider location: [facility] [place]   I connected with Rebecca Bean on 05/05/20 at  2:02 PM EDT by MyChart Video Encounter at home and verified that I am speaking with the correct person using two identifiers.   I discussed the limitations, risks, security and privacy concerns of performing an evaluation and management service virtually and the availability of in person appointments. I also discussed with the patient that there may be a patient responsible charge related to this service. The patient expressed understanding and agreed to proceed. Subjective:  Rebecca Bean is a 19 y.o. G1P0000 at [redacted]w[redacted]d being seen today for ongoing prenatal care.  She is currently monitored for the following issues for this low-risk pregnancy and has Supervision of normal first pregnancy; Morning sickness; Gonorrhea in pregnancy, antepartum, first trimester; Chlamydia trachomatis infection in pregnancy in first trimester; Group B Streptococcus carrier, +RV culture, currently pregnant; and Alpha thalassemia silent carrier on their problem list.  Patient reports abdominal cramping.  Contractions: Irritability. Vag. Bleeding: None.  Movement: Absent. Denies any leaking of fluid.   The following portions of the patient's history were reviewed and updated as appropriate: allergies, current medications, past family history, past medical history, past social history, past surgical history and problem list.   Objective:  There were no vitals filed for this visit.  Fetal Status:     Movement: Absent     General:  Alert, oriented and cooperative. Patient is in no acute distress.  Respiratory: Normal respiratory effort, no problems with respiration noted  Mental Status: Normal mood and affect. Normal behavior. Normal judgment and thought content.  Rest of physical exam deferred due to type of encounter  Imaging: [place] MFM OB DETAIL +14  WK  Result Date: 04/23/2020 ----------------------------------------------------------------------  OBSTETRICS REPORT                       (Signed Final 04/23/2020 01:47 pm) ---------------------------------------------------------------------- Patient Info  ID #:       [id number]                          D.O.B.:  Feb 19, 2001 (19 yrs)  Name:       Rebecca Bean                Visit Date: 04/23/2020 11:10 am ---------------------------------------------------------------------- Performed By  Attending:        Noralee Space MD        Ref. Address:     [address]                                                             [phone]  [place]                                                             [id number]  Performed By:     Reinaldo Raddle            [facility]  Referred By:      [facility] ---------------------------------------------------------------------- Orders  #  Description                           Code        Ordered By  1  [place] MFM OB DETAIL +14 WK               76811.01    Nolene Bernheim ----------------------------------------------------------------------  #  Order #                     Accession #                Episode #  1  [id number]                   [id number]                 [id number] ---------------------------------------------------------------------- Indications  [redacted] weeks gestation of pregnancy                Z3A.16  Genetic carrier Scientist, research (medical))                   Z14.8  Encounter for antenatal screening for          Z36.3  malformations (LR NIPS)  Medical complication of pregnancy (heart       O26.90  murmor) ---------------------------------------------------------------------- Fetal Evaluation  Num Of Fetuses:         1  Fetal Heart Rate(bpm):  158  Cardiac Activity:        Observed  Presentation:           Breech  Placenta:               Anterior  P. Cord Insertion:      Visualized, central  Amniotic Fluid  AFI FV:      Within normal limits                              Largest Pocket(cm)                              4.3 ---------------------------------------------------------------------- Biometry  BPD:      33.4  mm     G. Age:  16w 3d         58  %    CI:        75.82   %  70 - 86                                                          FL/HC:      16.1   %    13.3 - 16.5  HC:      121.6  mm     G. Age:  16w 0d         32  %    HC/AC:      1.22        1.05 - 1.39  AC:       99.5  mm     G. Age:  16w 0d         46  %    FL/BPD:     58.7   %  FL:       19.6  mm     G. Age:  15w 6d         32  %    FL/AC:      19.7   %    20 - 24  HUM:        19  mm     G. Age:  15w 4d         36  %  CER:      15.8  mm     G. Age:  15w 6d         42  %  NFT:       3.2  mm  LV:        6.9  mm  CM:        2.7  mm  Est. FW:     140  gm      0 lb 5 oz     29  % ---------------------------------------------------------------------- OB History  Blood Type:   O+  Gravidity:    1         Term:   0        Prem:   0        SAB:   0  TOP:          0       Ectopic:  0        Living: 0 ---------------------------------------------------------------------- Gestational Age  LMP:           19w 0d        Date:  12/12/19                 EDD:   09/17/20  U/S Today:     16w 1d                                        EDD:   10/07/20  Best:          16w 1d     Det. By:  U/S (04/23/20)           EDD:   10/07/20 ---------------------------------------------------------------------- Anatomy  Cranium:               Appears normal         Aortic Arch:            Not well visualized  Cavum:                 Appears normal         Ductal Arch:            Not well visualized  Ventricles:            Appears normal         Diaphragm:              Not well visualized  Choroid Plexus:        Appears normal         Stomach:                 Appears normal, left                                                                        sided  Cerebellum:            Appears normal         Abdomen:                Appears normal  Posterior Fossa:       Appears normal         Abdominal Wall:         Appears nml (cord                                                                        insert, abd wall)  Nuchal Fold:           Appears normal         Cord Vessels:           Appears normal (3                                                                        vessel cord)  Face:                  Appears normal         Kidneys:                Appear normal                         (orbits and profile)  Lips:                  Not well visualized    Bladder:                Appears normal  Thoracic:              Appears normal         Spine:  Normal transverse                                                                        views  Heart:                 Echogenic focus        Upper Extremities:      Visualized                         in LV  RVOT:                  Not well visualized    Lower Extremities:      Visualized  LVOT:                  Not well visualized  Other:  Fetus appears to be a female. Nasal bone visualized. Heels visualized.          Technically difficult due to early gestational age. Technically difficult          due to fetal position. ---------------------------------------------------------------------- Cervix Uterus Adnexa  Cervix  Length:           3.47  cm.  Normal appearance by transabdominal scan.  Uterus  No abnormality visualized.  Right Ovary  Within normal limits. No adnexal mass visualized.  Left Ovary  Within normal limits. No adnexal mass visualized.  Cul De Sac  No free fluid seen.  Adnexa  No abnormality visualized. ---------------------------------------------------------------------- Impression  We performed fetal anatomy scan. An echogenic intracardiac  focus is seen. No other makers of aneuploidies or  fetal  structural defects are seen. Fetal biometry is consistent with  16w 1d gestation. Amniotic fluid is normal and good fetal  activity is seen.  We have assigned her EDD at 10/07/2020.  On cell-free fetal DNA screening, the risks of fetal  aneuploidies are not increased .  I informed the patient that given that she had low rik for fetal  aneuploidies on cell-free fetal DNA screening, finding of  echogenic intracardiac focus should be considered a normal  variant and that the risk of trisomy 21 is not increased. I also  reassured that echogenic focus does not increase the risk of  cardiac defects. I also informed her that only amniocentesis  will give a defintive result on the fetal karyotype.  Patient opted not to have amniocentesis. ---------------------------------------------------------------------- Recommendations  -An appointment was made for her to return in 4 weeks for  completion of fetal anatomy (cardiac anatomy and sagittal  view of spine) ----------------------------------------------------------------------                  Tama High, MD Electronically Signed Final Report   04/23/2020 01:47 pm ----------------------------------------------------------------------   Assessment and Plan:  Pregnancy: G1P0000 at [redacted]w[redacted]d 1. Encounter for supervision of normal first pregnancy in second trimester - Patient doing well, reports occasional abdominal cramping  - Patient reports drinking 1 cup of water a day if any. Reports that she has not drank water or ate at all today. Discussed with patient importance of eating and hydration throughout pregnancy, discussed with patient need for 6-8 cups of water per day and at least 2 meals  with snack.  - Eating plan for pregnant women on AVS, patient verbalizes understanding   - Routine prenatal care - anticipatory guidance on upcoming appointment including next being in person to obtain AFP and TOC.  - Prenatal Vit-Fe Phos-FA-Omega (VITAFOL GUMMIES)  3.33-0.333-34.8 MG CHEW; Chew 3 tablets by mouth daily.  Dispense: 90 tablet; Refill: 3  2. Chlamydia trachomatis infection in pregnancy in first trimester - needs TOC at next visit   3. Gonorrhea in pregnancy, antepartum, first trimester - needs TOC at next visit    - patient reports drinking 1cup to no cups per day   Preterm labor symptoms and general obstetric precautions including but not limited to vaginal bleeding, contractions, leaking of fluid and fetal movement were reviewed in detail with the patient. I discussed the assessment and treatment plan with the patient. The patient was provided an opportunity to ask questions and all were answered. The patient agreed with the plan and demonstrated an understanding of the instructions. The patient was advised to call back or seek an in-person office evaluation/[facility] for any urgent or concerning symptoms. Please refer to After Visit Summary for other counseling recommendations.   I provided 9 minutes of face-to-face time during this encounter.  Return in about 4 weeks (around 06/02/2020) for ROB/AFP/TOC.  Future Appointments  Date Time Provider Department Center  05/21/2020 11:15 AM [facility]  05/21/2020 11:15 AM [facility] US2 [facility]    Sharyon CableVeronica C Michie Molnar, CNM [facility]

## 2020-05-13 ENCOUNTER — Telehealth: Payer: Self-pay | Admitting: Licensed Clinical Social Worker

## 2020-05-13 NOTE — Telephone Encounter (Signed)
Subjective: Rebecca Bean is a G1P0000 at [redacted]w[redacted]d completed contraception counseling.  She does not have a history of any mental health concerns. She is not currently sexually active. She was previously using BC patch for birth control. Patient states mother and extended family as her support system.  Birth Control History:  patch  MDM Patient counseled on all options for birth control today including LARC. Patient desires patch initiated for birth control   Assessment:  19 y.o. female considering patch for birth control  Plan: no further plan   Rebecca Bean, Rebecca Bean 05/13/2020 2:42 PM

## 2020-05-21 ENCOUNTER — Other Ambulatory Visit: Payer: Self-pay

## 2020-05-21 ENCOUNTER — Ambulatory Visit: Payer: Medicaid Other | Admitting: *Deleted

## 2020-05-21 ENCOUNTER — Ambulatory Visit: Payer: Medicaid Other | Attending: Obstetrics and Gynecology

## 2020-05-21 VITALS — BP 102/48 | HR 74

## 2020-05-21 DIAGNOSIS — Z3A20 20 weeks gestation of pregnancy: Secondary | ICD-10-CM

## 2020-05-21 DIAGNOSIS — O283 Abnormal ultrasonic finding on antenatal screening of mother: Secondary | ICD-10-CM

## 2020-05-21 DIAGNOSIS — Z362 Encounter for other antenatal screening follow-up: Secondary | ICD-10-CM | POA: Diagnosis not present

## 2020-06-02 ENCOUNTER — Encounter: Payer: Self-pay | Admitting: Obstetrics

## 2020-06-02 ENCOUNTER — Other Ambulatory Visit: Payer: Self-pay

## 2020-06-02 ENCOUNTER — Ambulatory Visit (INDEPENDENT_AMBULATORY_CARE_PROVIDER_SITE_OTHER): Payer: Medicaid Other | Admitting: Obstetrics and Gynecology

## 2020-06-02 VITALS — BP 93/57 | HR 80 | Wt 169.0 lb

## 2020-06-02 DIAGNOSIS — O99820 Streptococcus B carrier state complicating pregnancy: Secondary | ICD-10-CM

## 2020-06-02 DIAGNOSIS — O98311 Other infections with a predominantly sexual mode of transmission complicating pregnancy, first trimester: Secondary | ICD-10-CM

## 2020-06-02 DIAGNOSIS — A568 Sexually transmitted chlamydial infection of other sites: Secondary | ICD-10-CM

## 2020-06-02 DIAGNOSIS — Z3402 Encounter for supervision of normal first pregnancy, second trimester: Secondary | ICD-10-CM

## 2020-06-02 DIAGNOSIS — O98312 Other infections with a predominantly sexual mode of transmission complicating pregnancy, second trimester: Secondary | ICD-10-CM

## 2020-06-02 DIAGNOSIS — O219 Vomiting of pregnancy, unspecified: Secondary | ICD-10-CM

## 2020-06-02 DIAGNOSIS — D563 Thalassemia minor: Secondary | ICD-10-CM

## 2020-06-02 DIAGNOSIS — Z3A21 21 weeks gestation of pregnancy: Secondary | ICD-10-CM

## 2020-06-02 MED ORDER — ONDANSETRON 4 MG PO TBDP: 4.0000 mg | ORAL_TABLET | Freq: Four times a day (QID) | ORAL | 0 refills | Status: DC | PRN

## 2020-06-02 MED ORDER — AZITHROMYCIN 500 MG PO TABS: 1000.0000 mg | ORAL_TABLET | Freq: Once | ORAL | 0 refills | Status: AC

## 2020-06-02 NOTE — Progress Notes (Signed)
   PRENATAL VISIT NOTE  Subjective:  Rebecca Bean is a 19 y.o. G1P0000 at [redacted]w[redacted]d being seen today for ongoing prenatal care.  She is currently monitored for the following issues for this low-risk pregnancy and has Supervision of normal first pregnancy; Morning sickness; Gonorrhea in pregnancy, antepartum, first trimester; Chlamydia trachomatis infection in pregnancy in first trimester; Group B Streptococcus carrier, +RV culture, currently pregnant; and Alpha thalassemia silent carrier on their problem list.  Patient doing well with no acute concerns today. She reports backache.  Contractions: Not present. Vag. Bleeding: None.  Movement: Present. Denies leaking of fluid.    No TOC performed today.  Pt did not finish her azithromycin adequately and continues to have unprotected sex with her untreated partner.  Pt advised no further intercourse until partner is treated and can show proff of his TOC.  The following portions of the patient's history were reviewed and updated as appropriate: allergies, current medications, past family history, past medical history, past social history, past surgical history and problem list. Problem list updated.  Objective:   Vitals:   06/02/20 1345  BP: (!) 93/57  Pulse: 80  Weight: 169 lb (76.7 kg)    Fetal Status: Fetal Heart Rate (bpm): 159   Movement: Present     General:  Alert, oriented and cooperative. Patient is in no acute distress.  Skin: Skin is warm and dry. No rash noted.   Cardiovascular: Normal heart rate noted  Respiratory: Normal respiratory effort, no problems with respiration noted  Abdomen: Soft, gravid, appropriate for gestational age.  Pain/Pressure: Absent     Pelvic: Cervical exam deferred        Extremities: Normal range of motion.  Edema: None  Mental Status:  Normal mood and affect. Normal behavior. Normal judgment and thought content.   Assessment and Plan:  Pregnancy: G1P0000 at [redacted]w[redacted]d  1. Encounter for supervision of  normal first pregnancy in second trimester  - AFP, Serum, Open Spina Bifida  2. Group B Streptococcus carrier, +RV culture, currently pregnant Pt aware of findings and treatment needed during labor  3. Alpha thalassemia silent carrier   4. Nausea and vomiting during pregnancy rx given so pt can tolerate med for STD - ondansetron (ZOFRAN ODT) 4 MG disintegrating tablet; Take 1 tablet (4 mg total) by mouth every 6 (six) hours as needed for nausea.  Dispense: 4 tablet; Refill: 0  5. Chlamydia trachomatis infection in mother during second trimester of pregnancy  - azithromycin (ZITHROMAX) 500 MG tablet; Take 2 tablets (1,000 mg total) by mouth once for 1 dose.  Dispense: 2 tablet; Refill: 0   Preterm labor symptoms and general obstetric precautions including but not limited to vaginal bleeding, contractions, leaking of fluid and fetal movement were reviewed in detail with the patient.  Please refer to After Visit Summary for other counseling recommendations.   Return in about 4 weeks (around 06/30/2020) for ROB.   Mariel Aloe, MD

## 2020-06-02 NOTE — Progress Notes (Signed)
ROB/AFP.  Reports no problems today. Patient reported throwing up the medications and did not inform us, she has been having sex.

## 2020-06-02 NOTE — Patient Instructions (Addendum)
Second Trimester of Pregnancy  The second trimester is from week 14 through week 27 (month 4 through 6). This is often the time in pregnancy that you feel your best. Often times, morning sickness has lessened or quit. You may have more energy, and you may get hungry more often. Your unborn baby is growing rapidly. At the end of the sixth month, he or she is about 9 inches long and weighs about 1 pounds. You will likely feel the baby move between 18 and 20 weeks of pregnancy. Follow these instructions at home: Medicines  Take over-the-counter and prescription medicines only as told by your doctor. Some medicines are safe and some medicines are not safe during pregnancy.  Take a prenatal vitamin that contains at least 600 micrograms (mcg) of folic acid.  If you have trouble pooping (constipation), take medicine that will make your stool soft (stool softener) if your doctor approves. Eating and drinking   Eat regular, healthy meals.  Avoid raw meat and uncooked cheese.  If you get low calcium from the food you eat, talk to your doctor about taking a daily calcium supplement.  Avoid foods that are high in fat and sugars, such as fried and sweet foods.  If you feel sick to your stomach (nauseous) or throw up (vomit): ? Eat 4 or 5 small meals a day instead of 3 large meals. ? Try eating a few soda crackers. ? Drink liquids between meals instead of during meals.  To prevent constipation: ? Eat foods that are high in fiber, like fresh fruits and vegetables, whole grains, and beans. ? Drink enough fluids to keep your pee (urine) clear or pale yellow. Activity  Exercise only as told by your doctor. Stop exercising if you start to have cramps.  Do not exercise if it is too hot, too humid, or if you are in a place of great height (high altitude).  Avoid heavy lifting.  Wear low-heeled shoes. Sit and stand up straight.  You can continue to have sex unless your doctor tells you not  to. Relieving pain and discomfort  Wear a good support bra if your breasts are tender.  Take warm water baths (sitz baths) to soothe pain or discomfort caused by hemorrhoids. Use hemorrhoid cream if your doctor approves.  Rest with your legs raised if you have leg cramps or low back pain.  If you develop puffy, bulging veins (varicose veins) in your legs: ? Wear support hose or compression stockings as told by your doctor. ? Raise (elevate) your feet for 15 minutes, 3-4 times a day. ? Limit salt in your food. Prenatal care  Write down your questions. Take them to your prenatal visits.  Keep all your prenatal visits as told by your doctor. This is important. Safety  Wear your seat belt when driving.  Make a list of emergency phone numbers, including numbers for family, friends, the hospital, and police and fire departments. General instructions  Ask your doctor about the right foods to eat or for help finding a counselor, if you need these services.  Ask your doctor about local prenatal classes. Begin classes before month 6 of your pregnancy.  Do not use hot tubs, steam rooms, or saunas.  Do not douche or use tampons or scented sanitary pads.  Do not cross your legs for long periods of time.  Visit your dentist if you have not done so. Use a soft toothbrush to brush your teeth. Floss gently.  Avoid all smoking, herbs,   and alcohol. Avoid drugs that are not approved by your doctor.  Do not use any products that contain nicotine or tobacco, such as cigarettes and e-cigarettes. If you need help quitting, ask your doctor.  Avoid cat litter boxes and soil used by cats. These carry germs that can cause birth defects in the baby and can cause a loss of your baby (miscarriage) or stillbirth. Contact a doctor if:  You have mild cramps or pressure in your lower belly.  You have pain when you pee (urinate).  You have bad smelling fluid coming from your vagina.  You continue to  feel sick to your stomach (nauseous), throw up (vomit), or have watery poop (diarrhea).  You have a nagging pain in your belly area.  You feel dizzy. Get help right away if:  You have a fever.  You are leaking fluid from your vagina.  You have spotting or bleeding from your vagina.  You have severe belly cramping or pain.  You lose or gain weight rapidly.  You have trouble catching your breath and have chest pain.  You notice sudden or extreme puffiness (swelling) of your face, hands, ankles, feet, or legs.  You have not felt the baby move in over an hour.  You have severe headaches that do not go away when you take medicine.  You have trouble seeing. Summary  The second trimester is from week 14 through week 27 (months 4 through 6). This is often the time in pregnancy that you feel your best.  To take care of yourself and your unborn baby, you will need to eat healthy meals, take medicines only if your doctor tells you to do so, and do activities that are safe for you and your baby.  Call your doctor if you get sick or if you notice anything unusual about your pregnancy. Also, call your doctor if you need help with the right food to eat, or if you want to know what activities are safe for you. This information is not intended to replace advice given to you by your health care provider. Make sure you discuss any questions you have with your health care provider. Document Revised: 03/01/2019 Document Reviewed: 12/13/2016 Elsevier Patient Education  2020 [employer] Inc.  Chlamydia, Female  Chlamydia is a STD (sexually transmitted disease). This is an infection that spreads through sexual contact. If it is not treated, it can cause serious problems. It must be treated with antibiotic medicine. If this infection is not treated and you are pregnant or become pregnant, your baby could get it during delivery. This may cause bad health problems for the baby. Sometimes, you may not  have symptoms (asymptomatic). When you have symptoms, they can include:  Burning when you pee (urinate).  Peeing often.  Fluid (discharge) coming from the vagina.  Redness, soreness, and swelling (inflammation) of the butt (rectum).  Bleeding or fluid coming from the butt.  Belly (abdominal) pain.  Pain during sex.  Bleeding between periods.  Itching, burning, or redness in the eyes.  Fluid coming from the eyes. Follow these instructions at home: Medicines  Take over-the-counter and prescription medicines only as told by your doctor.  Take your antibiotic medicine as told by your doctor. Do not stop taking the antibiotic even if you start to feel better. Sexual activity  Tell sex partners about your infection. Sex partners are people you had oral, anal, or vaginal sex with within 60 days of when you started getting sick. They need treatment,  too.  Do not have sex until: ? You and your sex partners have been treated. ? Your doctor says it is okay.  If you have a single dose treatment, wait 7 days before having sex. General instructions  It is up to you to get your test results. Ask your doctor when your results will be ready.  Get a lot of rest.  Eat healthy foods.  Drink enough fluid to keep your pee (urine) clear or pale yellow.  Keep all follow-up visits as told by your doctor. You may need tests after 3 months. Preventing chlamydia  The only way to prevent chlamydia is not to have sex. To lower your risk: ? Use latex condoms correctly. Do this every time you have sex. ? Avoid having many sex partners. ? Ask if your partner has been tested for STDs and if he or she had negative results. Contact a doctor if:  You get new symptoms.  You do not get better with treatment.  You have a fever or chills.  You have pain during sex. Get help right away if:  Your pain gets worse and does not get better with medicine.  You get flu-like symptoms, such  as: ? Night sweats. ? Sore throat. ? Muscle aches.  You feel sick to your stomach (nauseous).  You throw up (vomit).  You have trouble swallowing.  You have bleeding: ? Between periods. ? After sex.  You have irregular periods.  You have belly pain that does not get better with medicine.  You have lower back pain that does not get better with medicine.  You feel weak or dizzy.  You pass out (faint).  You are pregnant and you get symptoms of chlamydia. Summary  Chlamydia is an infection that spreads through sexual contact.  Sometimes, chlamydia can cause no symptoms (asymptomatic).  Do not have sex until your doctor says it is okay.  All sex partners will have to be treated for chlamydia. This information is not intended to replace advice given to you by your health care provider. Make sure you discuss any questions you have with your health care provider. Document Revised: 05/01/2018 Document Reviewed: 10/27/2016 Elsevier Patient Education  2020 [employer].

## 2020-06-04 LAB — AFP, SERUM, OPEN SPINA BIFIDA
AFP MoM: 1.35
AFP Value: 98.4 ng/mL
Gest. Age on Collection Date: 21.6 weeks
Maternal Age At EDD: 19.1 yr
OSBR Risk 1 IN: 8303
Test Results:: NEGATIVE
Weight: 169 [lb_av]

## 2020-06-19 ENCOUNTER — Ambulatory Visit (HOSPITAL_COMMUNITY)
Admission: EM | Admit: 2020-06-19 | Discharge: 2020-06-19 | Disposition: A | Discharge status: Home / Self Care | Payer: Medicaid Other | Attending: Urgent Care | Admitting: Urgent Care

## 2020-06-19 ENCOUNTER — Inpatient Hospital Stay (HOSPITAL_COMMUNITY)
Admission: AD | Admit: 2020-06-19 | Discharge: 2020-06-19 | Disposition: A | Discharge status: Home / Self Care | Payer: Medicaid Other | Attending: Obstetrics and Gynecology | Admitting: Obstetrics and Gynecology

## 2020-06-19 ENCOUNTER — Ambulatory Visit (HOSPITAL_COMMUNITY): Payer: Self-pay

## 2020-06-19 ENCOUNTER — Encounter (HOSPITAL_COMMUNITY): Payer: Self-pay | Admitting: Obstetrics and Gynecology

## 2020-06-19 ENCOUNTER — Encounter (HOSPITAL_COMMUNITY): Payer: Self-pay

## 2020-06-19 ENCOUNTER — Other Ambulatory Visit: Payer: Self-pay

## 2020-06-19 DIAGNOSIS — Z3A27 27 weeks gestation of pregnancy: Secondary | ICD-10-CM | POA: Diagnosis present

## 2020-06-19 DIAGNOSIS — Z79899 Other long term (current) drug therapy: Secondary | ICD-10-CM | POA: Diagnosis not present

## 2020-06-19 DIAGNOSIS — N898 Other specified noninflammatory disorders of vagina: Secondary | ICD-10-CM

## 2020-06-19 DIAGNOSIS — O23592 Infection of other part of genital tract in pregnancy, second trimester: Secondary | ICD-10-CM | POA: Diagnosis not present

## 2020-06-19 DIAGNOSIS — Z3A24 24 weeks gestation of pregnancy: Secondary | ICD-10-CM

## 2020-06-19 DIAGNOSIS — O99891 Other specified diseases and conditions complicating pregnancy: Secondary | ICD-10-CM | POA: Diagnosis not present

## 2020-06-19 DIAGNOSIS — B9689 Other specified bacterial agents as the cause of diseases classified elsewhere: Secondary | ICD-10-CM | POA: Diagnosis not present

## 2020-06-19 DIAGNOSIS — O98812 Other maternal infectious and parasitic diseases complicating pregnancy, second trimester: Secondary | ICD-10-CM | POA: Insufficient documentation

## 2020-06-19 DIAGNOSIS — B3731 Acute candidiasis of vulva and vagina: Secondary | ICD-10-CM

## 2020-06-19 DIAGNOSIS — R1030 Lower abdominal pain, unspecified: Secondary | ICD-10-CM | POA: Insufficient documentation

## 2020-06-19 DIAGNOSIS — Z3402 Encounter for supervision of normal first pregnancy, second trimester: Secondary | ICD-10-CM

## 2020-06-19 DIAGNOSIS — B373 Candidiasis of vulva and vagina: Secondary | ICD-10-CM | POA: Diagnosis not present

## 2020-06-19 DIAGNOSIS — Z113 Encounter for screening for infections with a predominantly sexual mode of transmission: Secondary | ICD-10-CM | POA: Insufficient documentation

## 2020-06-19 DIAGNOSIS — Z87891 Personal history of nicotine dependence: Secondary | ICD-10-CM | POA: Diagnosis not present

## 2020-06-19 DIAGNOSIS — N760 Acute vaginitis: Secondary | ICD-10-CM

## 2020-06-19 LAB — WET PREP, GENITAL
Sperm: NONE SEEN
Trich, Wet Prep: NONE SEEN

## 2020-06-19 MED ORDER — TERCONAZOLE 0.4 % VA CREA: 1.0000 | TOPICAL_CREAM | Freq: Every day | VAGINAL | 0 refills | Status: DC

## 2020-06-19 MED ORDER — METRONIDAZOLE 500 MG PO TABS: 500.0000 mg | ORAL_TABLET | Freq: Two times a day (BID) | ORAL | 0 refills | Status: DC

## 2020-06-19 MED ORDER — TERCONAZOLE 0.4 % VA CREA: 1.0000 | TOPICAL_CREAM | Freq: Every day | VAGINAL | 0 refills | Status: AC

## 2020-06-19 NOTE — Discharge Instructions (Signed)
Please go to the [facility] for further evaluation of your abdominal pain

## 2020-06-19 NOTE — MAU Provider Note (Signed)
History     CSN: [id number]  Arrival date and time: 06/19/20 2026   First Provider Initiated Contact with Patient 06/19/20 2123      Chief Complaint  Patient presents with  . Vaginal Discharge  . Abdominal Cramping   Ms. Rebecca Bean is a 19 y.o. year old G62P0000 female at 21w2dweeks gestation who presents to MAU reporting sharp lower abdominal pain since May when she stands up at work and the pain radiates through to her lower back, increased white vaginal discharge since the beginning pregnancy and she feels "tingling" in her vagina.  She reports her last sexual intercourse was 2 weeks ago.  She denies vaginal bleeding.  She reports positive fetal movement.  [facility] last appointment was this month.   OB History    Gravida  1   Para  0   Term  0   Preterm  0   AB  0   Living        SAB  0   TAB  0   Ectopic  0   Multiple      Live Births              Past Medical History:  Diagnosis Date  . Heart murmur     Past Surgical History:  Procedure Laterality Date  . NO PAST SURGERIES      Family History  Problem Relation Age of Onset  . Hypertension Maternal Grandmother     Social History   Tobacco Use  . Smoking status: Former SResearch scientist (life sciences) . Smokeless tobacco: Never Used  Vaping Use  . Vaping Use: Never used  Substance Use Topics  . Alcohol use: No  . Drug use: No    Allergies: No Known Allergies  Medications Prior to Admission  Medication Sig Dispense Refill Last Dose  . Blood Pressure Monitoring (BLOOD PRESSURE KIT) DEVI 1 kit by Does not apply route once a week. Check Blood Pressure regularly and record readings into the Babyscripts App.  Large Cuff.  DX O90.0 1 each 0   . chlorhexidine (PERIDEX) 0.12 % solution Use as directed 15 mLs in the mouth or throat 2 (two) times daily. (Patient not taking: Reported on 05/21/2020) 120 mL 0   . Doxylamine-Pyridoxine (DICLEGIS) 10-10 MG TBEC Take 2 tablets at bedtime  and one in the morning and one in the afternoon as needed for nausea. (Patient not taking: Reported on 05/05/2020) 60 tablet 2   . hydrocortisone 1 % ointment Apply 1 application topically 2 (two) times daily. (Patient not taking: Reported on 05/21/2020) 30 g 0   . ondansetron (ZOFRAN ODT) 4 MG disintegrating tablet Take 1 tablet (4 mg total) by mouth every 6 (six) hours as needed for nausea. 4 tablet 0   . Prenatal Vit-Fe Phos-FA-Omega (VITAFOL GUMMIES) 3.33-0.333-34.8 MG CHEW Chew 3 tablets by mouth daily. 90 tablet 3     Review of Systems  Constitutional: Negative.   HENT: Negative.   Eyes: Negative.   Respiratory: Negative.   Cardiovascular: Negative.   Gastrointestinal: Negative.   Endocrine: Negative.   Genitourinary: Positive for vaginal discharge (increased throughout pregnancy) and vaginal pain ("tingling" in vagina).  Musculoskeletal: Negative.   Skin: Negative.   Allergic/Immunologic: Negative.   Neurological: Negative.   Hematological: Negative.   Psychiatric/Behavioral: Negative.    Physical Exam   Blood pressure (!) 115/61, pulse 87, temperature 98.6 F (37 C), temperature source Oral, resp. rate 17, last menstrual period  12/12/2019, SpO2 99 %.  Physical Exam Vitals and nursing note reviewed. Exam conducted with a chaperone present.  Constitutional:      Appearance: Normal appearance. She is normal weight.  HENT:     Head: Normocephalic and atraumatic.  Cardiovascular:     Rate and Rhythm: Normal rate.  Pulmonary:     Effort: Pulmonary effort is normal.  Genitourinary:    General: Normal vulva.     Vagina: Vaginal discharge (increased, white) present.     Comments: Uterus: gravid, S=D, SE: cervix is smooth, pink, no lesions, small amt of thick, white vaginal d/c -- WP, GC/CT done, closed/long/firm, no CMT or friability, no adnexal tenderness  Musculoskeletal:        General: Normal range of motion.     Cervical back: Normal range of motion.  Skin:    General:  Skin is warm and dry.  Neurological:     Mental Status: She is alert and oriented to person, place, and time.  Psychiatric:        Mood and Affect: Mood normal.        Behavior: Behavior normal.        Thought Content: Thought content normal.        Judgment: Judgment normal.   REACTIVE NST - FHR: 150 bpm / moderate variability / accels present / decels absent / TOCO: none MAU Course  Procedures  MDM Wet Prep GC/CT -- Results pending  Results for orders placed or performed during the hospital encounter of 06/19/20 (from the past 24 hour(s))  Wet prep, genital     Status: Abnormal   Collection Time: 06/19/20  9:26 PM   Specimen: Cervix  Result Value Ref Range   Yeast Wet Prep HPF POC PRESENT (A) NONE SEEN   Trich, Wet Prep NONE SEEN NONE SEEN   Clue Cells Wet Prep HPF POC PRESENT (A) NONE SEEN   WBC, Wet Prep HPF POC MANY (A) NONE SEEN   Sperm NONE SEEN     Assessment and Plan  Bacterial vaginosis - Rx for Flagyl 500 mg BID x 7 days - Information provided on BV   Candida vaginitis - Rx for Terazol 4% per vagina hs x 7 days - Information provided on vaginal yeast   - Discharge home - Keep scheduled appt with [facility] on 06/30/20  - Patient verbalized an understanding of the plan of care and agrees.   Laury Deep, MSN, CNM 06/19/2020, 9:23 PM

## 2020-06-19 NOTE — MAU Note (Signed)
.  Rebecca Bean is a 18 y.o. at [redacted]w[redacted]d here in MAU reporting: pt went to Urgent Care for a check up and they asked her of any other symptoms she was experiencing. Pt informed them that she has been feeling sharp lower abdominal pains since May and has been experiencing white discharge, so they sent her here to MAU. Pt reports she feels the sharp pains when she stands up at work and they radiate through to her lower back. Pt reports her discharge has increased in amount since she became pregnant. She describes it as white discharge with no odor. Pt reports feeling tingling in her vagina as well. +FM. No VB.   Last intercourse: 2 weeks ago FHR: 151 initial Pain score: 0/10 Lab orders placed from triage: UA

## 2020-06-19 NOTE — ED Triage Notes (Signed)
Pt c/o white vaginal discharge, no odor, lower abdominal pain for approx 1 month. Pt concerned she was re-exposed to gonorrhea and chlamydia from partner. Denies dysuria sx, fever.  Pt is approx 6 months pregnant.

## 2020-06-19 NOTE — Discharge Instructions (Signed)
Use the Metronidazole FIRST. Then use Terazol cream once Metronidazole course is complete.

## 2020-06-20 NOTE — ED Provider Notes (Signed)
[facility]    CSN: [id number] Arrival date & time: 06/19/20  1817      History   Chief Complaint Chief Complaint  Patient presents with  . Exposure to STD    HPI Rebecca Bean is a 19 y.o. female.   Rebecca Bean presents with concerns about STDs. States that she was found to have chlamydia and gonorrhea, treated for this 03/11/20, but her partner did not get treated and they have had unprotected intercourse. She has white vaginal discharge. She is 6 months pregnant, next ob appointment 8/10. No vaginal bleeding. She states that now she also has low pelvic pain. This is new since her last ob appointment. No fevers. No urinary symptoms.    ROS per HPI, negative if not otherwise mentioned.      Past Medical History:  Diagnosis Date  . Heart murmur     Patient Active Problem List   Diagnosis Date Noted  . Alpha thalassemia silent carrier 03/23/2020  . Group B Streptococcus carrier, +RV culture, currently pregnant 03/15/2020  . Gonorrhea in pregnancy, antepartum, first trimester 03/11/2020  . Chlamydia trachomatis infection in pregnancy in first trimester 03/11/2020  . Morning sickness 03/10/2020  . Supervision of normal first pregnancy 02/10/2020    Past Surgical History:  Procedure Laterality Date  . NO PAST SURGERIES      OB History    Gravida  1   Para  0   Term  0   Preterm  0   AB  0   Living        SAB  0   TAB  0   Ectopic  0   Multiple      Live Births               Home Medications    Prior to Admission medications   Medication Sig Start Date End Date Taking? Authorizing Provider  Prenatal Vit-Fe Phos-FA-Omega (VITAFOL GUMMIES) 3.33-0.333-34.8 MG CHEW Chew 3 tablets by mouth daily. 05/05/20  Yes Lajean Manes, CNM  Blood Pressure Monitoring (BLOOD PRESSURE KIT) DEVI 1 kit by Does not apply route once a week. Check Blood Pressure regularly and record readings into the Babyscripts App.  Large Cuff.  DX O90.0  03/09/20   Burleson, Rona Ravens, NP  chlorhexidine (PERIDEX) 0.12 % solution Use as directed 15 mLs in the mouth or throat 2 (two) times daily. Patient not taking: Reported on 05/21/2020 02/09/20   Domenic Moras, PA-C  Doxylamine-Pyridoxine (DICLEGIS) 10-10 MG TBEC Take 2 tablets at bedtime and one in the morning and one in the afternoon as needed for nausea. Patient not taking: Reported on 05/05/2020 03/10/20   Virginia Rochester, NP  hydrocortisone 1 % ointment Apply 1 application topically 2 (two) times daily. Patient not taking: Reported on 05/21/2020 12/14/15   Drenda Freeze, MD  metroNIDAZOLE (FLAGYL) 500 MG tablet Take 1 tablet (500 mg total) by mouth 2 (two) times daily. 06/19/20   Laury Deep, CNM  ondansetron (ZOFRAN ODT) 4 MG disintegrating tablet Take 1 tablet (4 mg total) by mouth every 6 (six) hours as needed for nausea. 06/02/20   Griffin Basil, MD  terconazole (TERAZOL 7) 0.4 % vaginal cream Place 1 applicator vaginally at bedtime for 7 days. USE AFTER FLAGYL COURSE IS COMPLETE 06/19/20 06/26/20  Laury Deep, CNM    Family History Family History  Problem Relation Age of Onset  . Hypertension Maternal Grandmother     Social History Social History  Tobacco Use  . Smoking status: Former Research scientist (life sciences)  . Smokeless tobacco: Never Used  Vaping Use  . Vaping Use: Never used  Substance Use Topics  . Alcohol use: No  . Drug use: No     Allergies   Patient has no known allergies.   Review of Systems Review of Systems   Physical Exam Triage Vital Signs ED Triage Vitals [06/19/20 1938]  Enc Vitals Group     BP (!) 119/87     Pulse Rate 83     Resp 18     Temp 99.5 F (37.5 C)     Temp Source Oral     SpO2 99 %     Weight      Height      Head Circumference      Peak Flow      Pain Score 0     Pain Loc      Pain Edu?      Excl. in [place]?    No data found.  Updated Vital Signs BP (!) 119/87 (BP Location: Right Arm)   Pulse 83   Temp 99.5 F (37.5 C) (Oral)    Resp 18   LMP 12/12/2019 (Exact Date)   SpO2 99%   Visual Acuity Right Eye Distance:   Left Eye Distance:   Bilateral Distance:    Right Eye Near:   Left Eye Near:    Bilateral Near:     Physical Exam Constitutional:      General: She is not in acute distress.    Appearance: She is well-developed.  Cardiovascular:     Rate and Rhythm: Normal rate.  Pulmonary:     Effort: Pulmonary effort is normal.  Skin:    General: Skin is warm and dry.  Neurological:     Mental Status: She is alert and oriented to person, place, and time.      UC Treatments / Results  Labs (all labs ordered are listed, but only abnormal results are displayed) Labs Reviewed  CERVICOVAGINAL ANCILLARY ONLY    EKG   Radiology No results found.  Procedures Procedures (including critical care time)  Medications Ordered in UC Medications - No data to display  Initial Impression / Assessment and Plan / UC Course  I have reviewed the triage vital signs and the nursing notes.  Pertinent labs & imaging results that were available during my care of the patient were reviewed by me and considered in my medical decision making (see chart for details).     Vaginal cytology collected and pending, however, with new onset of pelvic pain in this pregnant patient recommended patient seek further evaluation at [facility] hospital. Patient verbalized understanding and agreeable to plan, states she will go there now by private vehicle.   Final Clinical Impressions(s) / UC Diagnoses   Final diagnoses:  Lower abdominal pain  [redacted] weeks gestation of pregnancy  Screen for STD (sexually transmitted disease)     Discharge Instructions     Please go to the [facility] for further evaluation of your abdominal pain    ED Prescriptions    None     PDMP not reviewed this encounter.   Zigmund Gottron, NP 06/20/20 2216

## 2020-06-22 LAB — GC/CHLAMYDIA PROBE AMP (~~LOC~~) NOT AT ARMC
Chlamydia: NEGATIVE
Comment: NEGATIVE
Comment: NORMAL
Neisseria Gonorrhea: NEGATIVE

## 2020-06-22 LAB — CERVICOVAGINAL ANCILLARY ONLY
Bacterial Vaginitis (gardnerella): POSITIVE — AB
Candida Glabrata: NEGATIVE
Candida Vaginitis: POSITIVE — AB
Chlamydia: NEGATIVE
Comment: NEGATIVE
Comment: NEGATIVE
Comment: NEGATIVE
Comment: NEGATIVE
Comment: NEGATIVE
Comment: NORMAL
Neisseria Gonorrhea: NEGATIVE
Trichomonas: NEGATIVE

## 2020-06-29 ENCOUNTER — Encounter (HOSPITAL_COMMUNITY): Payer: Self-pay | Admitting: *Deleted

## 2020-06-29 ENCOUNTER — Other Ambulatory Visit: Payer: Self-pay

## 2020-06-29 ENCOUNTER — Emergency Department (HOSPITAL_COMMUNITY)
Admission: EM | Admit: 2020-06-29 | Discharge: 2020-06-29 | Disposition: A | Discharge status: Home / Self Care | Payer: Medicaid Other | Attending: Emergency Medicine | Admitting: Emergency Medicine

## 2020-06-29 ENCOUNTER — Ambulatory Visit (HOSPITAL_COMMUNITY)
Admission: EM | Admit: 2020-06-29 | Discharge: 2020-06-29 | Disposition: A | Discharge status: Home / Self Care | Payer: Medicaid Other

## 2020-06-29 DIAGNOSIS — Z87891 Personal history of nicotine dependence: Secondary | ICD-10-CM | POA: Insufficient documentation

## 2020-06-29 DIAGNOSIS — Y999 Unspecified external cause status: Secondary | ICD-10-CM | POA: Diagnosis not present

## 2020-06-29 DIAGNOSIS — Y939 Activity, unspecified: Secondary | ICD-10-CM | POA: Diagnosis not present

## 2020-06-29 DIAGNOSIS — W228XXA Striking against or struck by other objects, initial encounter: Secondary | ICD-10-CM | POA: Diagnosis not present

## 2020-06-29 DIAGNOSIS — Y9289 Other specified places as the place of occurrence of the external cause: Secondary | ICD-10-CM | POA: Diagnosis not present

## 2020-06-29 DIAGNOSIS — M79674 Pain in right toe(s): Secondary | ICD-10-CM

## 2020-06-29 DIAGNOSIS — S99921A Unspecified injury of right foot, initial encounter: Secondary | ICD-10-CM | POA: Diagnosis not present

## 2020-06-29 NOTE — ED Provider Notes (Signed)
[facility] EMERGENCY DEPARTMENT Provider Note   CSN: [id number] Arrival date & time: 06/29/20  1842     History Chief Complaint  Patient presents with  . Toe Pain    right great toe    Rebecca Bean is a 19 y.o. female.   Toe Pain This is a new problem. The current episode started yesterday. The problem occurs constantly. The problem has been gradually improving. Pertinent negatives include no chest pain, no headaches and no shortness of breath. The symptoms are aggravated by standing and walking. The symptoms are relieved by rest and position. She has tried nothing for the symptoms. The treatment provided mild relief.       Past Medical History:  Diagnosis Date  . Heart murmur     Patient Active Problem List   Diagnosis Date Noted  . Alpha thalassemia silent carrier 03/23/2020  . Group B Streptococcus carrier, +RV culture, currently pregnant 03/15/2020  . Gonorrhea in pregnancy, antepartum, first trimester 03/11/2020  . Chlamydia trachomatis infection in pregnancy in first trimester 03/11/2020  . Morning sickness 03/10/2020  . Supervision of normal first pregnancy 02/10/2020    Past Surgical History:  Procedure Laterality Date  . NO PAST SURGERIES       OB History    Gravida  1   Para  0   Term  0   Preterm  0   AB  0   Living        SAB  0   TAB  0   Ectopic  0   Multiple      Live Births              Family History  Problem Relation Age of Onset  . Hypertension Maternal Grandmother     Social History   Tobacco Use  . Smoking status: Former Research scientist (life sciences)  . Smokeless tobacco: Never Used  Vaping Use  . Vaping Use: Never used  Substance Use Topics  . Alcohol use: No  . Drug use: No    Home Medications Prior to Admission medications   Medication Sig Start Date End Date Taking? Authorizing Provider  Blood Pressure Monitoring (BLOOD PRESSURE KIT) DEVI 1 kit by Does not apply route once a week. Check Blood Pressure  regularly and record readings into the Babyscripts App.  Large Cuff.  DX O90.0 03/09/20   Burleson, Rona Ravens, NP  chlorhexidine (PERIDEX) 0.12 % solution Use as directed 15 mLs in the mouth or throat 2 (two) times daily. Patient not taking: Reported on 05/21/2020 02/09/20   Domenic Moras, PA-C  Doxylamine-Pyridoxine (DICLEGIS) 10-10 MG TBEC Take 2 tablets at bedtime and one in the morning and one in the afternoon as needed for nausea. Patient not taking: Reported on 05/05/2020 03/10/20   Virginia Rochester, NP  hydrocortisone 1 % ointment Apply 1 application topically 2 (two) times daily. Patient not taking: Reported on 05/21/2020 12/14/15   Drenda Freeze, MD  metroNIDAZOLE (FLAGYL) 500 MG tablet Take 1 tablet (500 mg total) by mouth 2 (two) times daily. 06/19/20   Laury Deep, CNM  ondansetron (ZOFRAN ODT) 4 MG disintegrating tablet Take 1 tablet (4 mg total) by mouth every 6 (six) hours as needed for nausea. 06/02/20   Griffin Basil, MD  Prenatal Vit-Fe Phos-FA-Omega (VITAFOL GUMMIES) 3.33-0.333-34.8 MG CHEW Chew 3 tablets by mouth daily. 05/05/20   Lajean Manes, CNM    Allergies    Patient has no known allergies.  Review of Systems  Review of Systems  Constitutional: Negative for chills and fever.  HENT: Negative for congestion and rhinorrhea.   Respiratory: Negative for cough and shortness of breath.   Cardiovascular: Negative for chest pain and palpitations.  Gastrointestinal: Negative for diarrhea, nausea and vomiting.  Genitourinary: Negative for difficulty urinating and dysuria.  Musculoskeletal: Positive for arthralgias. Negative for back pain.  Skin: Negative for rash and wound.  Neurological: Negative for light-headedness and headaches.    Physical Exam Updated Vital Signs BP (!) 101/53 (BP Location: Left Arm)   Pulse 78   Temp 97.9 F (36.6 C) (Oral)   Resp 16   Ht 5' 6"  (1.676 m)   Wt 80.5 kg   LMP 12/12/2019 (Exact Date)   SpO2 100%   BMI 28.64 kg/m    Physical Exam Vitals and nursing note reviewed. Exam conducted with a chaperone present.  Constitutional:      General: She is not in acute distress.    Appearance: Normal appearance.  HENT:     Head: Normocephalic and atraumatic.     Nose: No rhinorrhea.  Eyes:     General:        Right eye: No discharge.        Left eye: No discharge.     Conjunctiva/sclera: Conjunctivae normal.  Cardiovascular:     Rate and Rhythm: Normal rate and regular rhythm.  Pulmonary:     Effort: Pulmonary effort is normal. No respiratory distress.     Breath sounds: No stridor.  Abdominal:     General: Abdomen is flat. There is no distension.     Palpations: Abdomen is soft.  Musculoskeletal:        General: Signs of injury present. No tenderness.     Comments: Slight deformity to the distalmost portion of the right great toenail, no subungual hematoma normal range of motion no focal bony tenderness sensation intact motor function intact good capillary refill  Skin:    General: Skin is warm and dry.  Neurological:     General: No focal deficit present.     Mental Status: She is alert. Mental status is at baseline.     Motor: No weakness.  Psychiatric:        Mood and Affect: Mood normal.        Behavior: Behavior normal.     ED Results / Procedures / Treatments   Labs (all labs ordered are listed, but only abnormal results are displayed) Labs Reviewed - No data to display  EKG None  Radiology No results found.  Procedures Procedures (including critical care time)  Medications Ordered in ED Medications - No data to display  ED Course  I have reviewed the triage vital signs and the nursing notes.  Pertinent labs & imaging results that were available during my care of the patient were reviewed by me and considered in my medical decision making (see chart for details).    MDM Rules/Calculators/A&P                          Minor damage to the distalmost portion of her right  great toenail, no need for removal no need for trephination, safe for outpatient management.  X-rays are offered and declined by patient, do not feel that she has significant deformity or bony tenderness so I agree is likely not necessary.  Recommend buddy taping, outpatient follow-up as needed.  She is 6 months pregnant, having no contractions no loss of  fluid no bleeding and feeling baby move normally.  No work-up needed for complication related to her pregnancy.  Final Clinical Impression(s) / ED Diagnoses Final diagnoses:  Toe pain, right    Rx / DC Orders ED Discharge Orders    None       Breck Coons, MD 06/29/20 2054

## 2020-06-29 NOTE — ED Triage Notes (Signed)
Patient was running from a dog and hit her right great toe.  It is swollen and painful. Patient has not had any meds today.  She is 6 months preg.  Denies hitting her abdomen.

## 2020-06-30 ENCOUNTER — Encounter: Payer: Self-pay | Admitting: Obstetrics

## 2020-06-30 ENCOUNTER — Ambulatory Visit (INDEPENDENT_AMBULATORY_CARE_PROVIDER_SITE_OTHER): Payer: Medicaid Other | Admitting: Advanced Practice Midwife

## 2020-06-30 VITALS — BP 112/68 | HR 83 | Wt 173.2 lb

## 2020-06-30 DIAGNOSIS — O98311 Other infections with a predominantly sexual mode of transmission complicating pregnancy, first trimester: Secondary | ICD-10-CM

## 2020-06-30 DIAGNOSIS — Z3A25 25 weeks gestation of pregnancy: Secondary | ICD-10-CM

## 2020-06-30 DIAGNOSIS — O98211 Gonorrhea complicating pregnancy, first trimester: Secondary | ICD-10-CM

## 2020-06-30 DIAGNOSIS — Z3402 Encounter for supervision of normal first pregnancy, second trimester: Secondary | ICD-10-CM

## 2020-06-30 DIAGNOSIS — A568 Sexually transmitted chlamydial infection of other sites: Secondary | ICD-10-CM

## 2020-06-30 NOTE — Patient Instructions (Addendum)

## 2020-06-30 NOTE — Progress Notes (Signed)
Pt is here for ROB, [redacted]w[redacted]d.  

## 2020-06-30 NOTE — Progress Notes (Signed)
   PRENATAL VISIT NOTE  Subjective:  Rebecca Bean is a 19 y.o. G1P0000 at [redacted]w[redacted]d being seen today for ongoing prenatal care.  She is currently monitored for the following issues for this low-risk pregnancy and has Supervision of normal first pregnancy; Morning sickness; Gonorrhea in pregnancy, antepartum, first trimester; Chlamydia trachomatis infection in pregnancy in first trimester; Group B Streptococcus carrier, +RV culture, currently pregnant; and Alpha thalassemia silent carrier on their problem list.  Patient reports no complaints.  Contractions: Not present. Vag. Bleeding: None.  Movement: Present. Denies leaking of fluid.   The following portions of the patient's history were reviewed and updated as appropriate: allergies, current medications, past family history, past medical history, past social history, past surgical history and problem list.   Objective:   Vitals:   06/30/20 1340  BP: 112/68  Pulse: 83  Weight: 173 lb 3.2 oz (78.6 kg)    Fetal Status: Fetal Heart Rate (bpm): 150   Movement: Present     General:  Alert, oriented and cooperative. Patient is in no acute distress.  Skin: Skin is warm and dry. No rash noted.   Cardiovascular: Normal heart rate noted  Respiratory: Normal respiratory effort, no problems with respiration noted  Abdomen: Soft, gravid, appropriate for gestational age.  Pain/Pressure: Absent     Pelvic: Cervical exam deferred        Extremities: Normal range of motion.  Edema: None  Mental Status: Normal mood and affect. Normal behavior. Normal judgment and thought content.   Assessment and Plan:  Pregnancy: G1P0000 at [redacted]w[redacted]d 1. Encounter for supervision of normal first pregnancy in second trimester --Anticipatory guidance about next visits/weeks of pregnancy given. -- 4 weeks for GTT  2. Gonorrhea in pregnancy, antepartum, first trimester -Positive, tx on 03/10/20, TOC negative 06/11/20  3. Chlamydia trachomatis infection in pregnancy in  first trimester -Positive, tx on 03/10/20, TOC negative 06/11/20  4. [redacted] weeks gestation of pregnancy   Preterm labor symptoms and general obstetric precautions including but not limited to vaginal bleeding, contractions, leaking of fluid and fetal movement were reviewed in detail with the patient. Please refer to After Visit Summary for other counseling recommendations.   Return in about 4 weeks (around 07/28/2020).  Future Appointments  Date Time Provider Department Center  07/28/2020  8:15 AM [facility] None  07/28/2020  9:00 AM Johnny Bridge, MD [facility] None    Sharen Counter, CNM

## 2020-07-08 ENCOUNTER — Ambulatory Visit (HOSPITAL_COMMUNITY)
Admission: EM | Admit: 2020-07-08 | Discharge: 2020-07-08 | Disposition: A | Discharge status: Home / Self Care | Payer: Medicaid Other | Attending: Internal Medicine | Admitting: Internal Medicine

## 2020-07-08 ENCOUNTER — Other Ambulatory Visit: Payer: Self-pay

## 2020-07-08 ENCOUNTER — Encounter (HOSPITAL_COMMUNITY): Payer: Self-pay

## 2020-07-08 DIAGNOSIS — Z20822 Contact with and (suspected) exposure to covid-19: Secondary | ICD-10-CM | POA: Insufficient documentation

## 2020-07-08 NOTE — ED Triage Notes (Signed)
Pt presents for COVID testing after exposure 3  days ago. Denies any  symptoms.

## 2020-07-09 LAB — SARS CORONAVIRUS 2 (TAT 6-24 HRS): SARS Coronavirus 2: NEGATIVE

## 2020-07-28 ENCOUNTER — Other Ambulatory Visit: Payer: Medicaid Other

## 2020-07-28 ENCOUNTER — Ambulatory Visit (INDEPENDENT_AMBULATORY_CARE_PROVIDER_SITE_OTHER): Payer: Medicaid Other | Admitting: Obstetrics and Gynecology

## 2020-07-28 ENCOUNTER — Other Ambulatory Visit: Payer: Self-pay

## 2020-07-28 ENCOUNTER — Encounter: Payer: Self-pay | Admitting: Obstetrics and Gynecology

## 2020-07-28 VITALS — BP 124/73 | HR 85 | Wt 177.0 lb

## 2020-07-28 DIAGNOSIS — O98211 Gonorrhea complicating pregnancy, first trimester: Secondary | ICD-10-CM

## 2020-07-28 DIAGNOSIS — Z3403 Encounter for supervision of normal first pregnancy, third trimester: Secondary | ICD-10-CM

## 2020-07-28 DIAGNOSIS — O98311 Other infections with a predominantly sexual mode of transmission complicating pregnancy, first trimester: Secondary | ICD-10-CM

## 2020-07-28 DIAGNOSIS — O99820 Streptococcus B carrier state complicating pregnancy: Secondary | ICD-10-CM

## 2020-07-28 DIAGNOSIS — D563 Thalassemia minor: Secondary | ICD-10-CM

## 2020-07-28 DIAGNOSIS — A568 Sexually transmitted chlamydial infection of other sites: Secondary | ICD-10-CM

## 2020-07-28 NOTE — Progress Notes (Signed)
Pt would like Tdap at next visit.   Pt has no complaints or concerns today.

## 2020-07-28 NOTE — Progress Notes (Signed)
   PRENATAL VISIT NOTE  Subjective:  Rebecca Bean is a 19 y.o. G1P0000 at [redacted]w[redacted]d being seen today for ongoing prenatal care.  She is currently monitored for the following issues for this low-risk pregnancy and has Supervision of normal first pregnancy; Morning sickness; Gonorrhea in pregnancy, antepartum, first trimester; Chlamydia trachomatis infection in pregnancy in first trimester; Group B Streptococcus carrier, +RV culture, currently pregnant; and Alpha thalassemia silent carrier on their problem list.  Patient reports no complaints.  Contractions: Not present. Vag. Bleeding: None.  Movement: Present. Denies leaking of fluid.   The following portions of the patient's history were reviewed and updated as appropriate: allergies, current medications, past family history, past medical history, past social history, past surgical history and problem list.   Objective:   Vitals:   07/28/20 0909  BP: 124/73  Pulse: 85  Weight: 177 lb (80.3 kg)    Fetal Status: Fetal Heart Rate (bpm): 140 Fundal Height: 29 cm Movement: Present     General:  Alert, oriented and cooperative. Patient is in no acute distress.  Skin: Skin is warm and dry. No rash noted.   Cardiovascular: Normal heart rate noted  Respiratory: Normal respiratory effort, no problems with respiration noted  Abdomen: Soft, gravid, appropriate for gestational age.  Pain/Pressure: Absent     Pelvic: Cervical exam deferred        Extremities: Normal range of motion.     Mental Status: Normal mood and affect. Normal behavior. Normal judgment and thought content.   Assessment and Plan:  Pregnancy: G1P0000 at [redacted]w[redacted]d 1. Encounter for supervision of normal first pregnancy in third trimester  - Glucose Tolerance, 2 Hours w/1 Hour - CBC - RPR - HIV Antibody (routine testing w rflx)  2. Gonorrhea in pregnancy, antepartum, first trimester - TOC neg 06/11/20.  Will repeat cultures at 36 weeks.  3. Chlamydia trachomatis infection in  pregnancy in first trimester - TOC neg 06/11/20.  Will repeat cultures at 36 weeks.    4. Group B Streptococcus carrier, +RV culture, currently pregnant - Will treat in labor.  5. Alpha thalassemia silent carrier - stable.  Preterm labor symptoms and general obstetric precautions including but not limited to vaginal bleeding, contractions, leaking of fluid and fetal movement were reviewed in detail with the patient. Please refer to After Visit Summary for other counseling recommendations.   Return in about 2 weeks (around 08/11/2020) for ROB - Virtual.  No future appointments.  Johnny Bridge, MD

## 2020-07-29 ENCOUNTER — Encounter: Payer: Self-pay | Admitting: Obstetrics & Gynecology

## 2020-07-29 ENCOUNTER — Other Ambulatory Visit: Payer: Self-pay | Admitting: Obstetrics & Gynecology

## 2020-07-29 DIAGNOSIS — D509 Iron deficiency anemia, unspecified: Secondary | ICD-10-CM

## 2020-07-29 LAB — CBC
Hematocrit: 28.5 % — ABNORMAL LOW (ref 34.0–46.6)
Hemoglobin: 9.6 g/dL — ABNORMAL LOW (ref 11.1–15.9)
MCH: 27.7 pg (ref 26.6–33.0)
MCHC: 33.7 g/dL (ref 31.5–35.7)
MCV: 82 fL (ref 79–97)
Platelets: 281 10*3/uL (ref 150–450)
RBC: 3.47 x10E6/uL — ABNORMAL LOW (ref 3.77–5.28)
RDW: 12.9 % (ref 11.7–15.4)
WBC: 14.3 10*3/uL — ABNORMAL HIGH (ref 3.4–10.8)

## 2020-07-29 LAB — GLUCOSE TOLERANCE, 2 HOURS W/ 1HR
Glucose, 1 hour: 110 mg/dL (ref 65–179)
Glucose, 2 hour: 85 mg/dL (ref 65–152)
Glucose, Fasting: 73 mg/dL (ref 65–91)

## 2020-07-29 LAB — RPR: RPR Ser Ql: NONREACTIVE

## 2020-07-29 LAB — HIV ANTIBODY (ROUTINE TESTING W REFLEX): HIV Screen 4th Generation wRfx: NONREACTIVE

## 2020-07-29 MED ORDER — DOCUSATE SODIUM 100 MG PO CAPS: 100.0000 mg | ORAL_CAPSULE | Freq: Two times a day (BID) | ORAL | 2 refills | Status: DC | PRN

## 2020-07-29 MED ORDER — FERROUS SULFATE 325 (65 FE) MG PO TABS: 325.0000 mg | ORAL_TABLET | Freq: Two times a day (BID) | ORAL | 3 refills | Status: DC

## 2020-08-10 ENCOUNTER — Encounter (HOSPITAL_COMMUNITY): Payer: Self-pay | Admitting: Obstetrics & Gynecology

## 2020-08-10 ENCOUNTER — Inpatient Hospital Stay (HOSPITAL_COMMUNITY)
Admission: AD | Admit: 2020-08-10 | Discharge: 2020-08-11 | Disposition: A | Discharge status: Home / Self Care | Payer: Medicaid Other | Attending: Obstetrics & Gynecology | Admitting: Obstetrics & Gynecology

## 2020-08-10 ENCOUNTER — Inpatient Hospital Stay (HOSPITAL_COMMUNITY): Admission: AD | Admit: 2020-08-10 | Payer: Medicaid Other | Source: Home / Self Care

## 2020-08-10 ENCOUNTER — Other Ambulatory Visit: Payer: Self-pay

## 2020-08-10 DIAGNOSIS — D509 Iron deficiency anemia, unspecified: Secondary | ICD-10-CM | POA: Insufficient documentation

## 2020-08-10 DIAGNOSIS — N949 Unspecified condition associated with female genital organs and menstrual cycle: Secondary | ICD-10-CM

## 2020-08-10 DIAGNOSIS — R102 Pelvic and perineal pain: Secondary | ICD-10-CM | POA: Insufficient documentation

## 2020-08-10 DIAGNOSIS — O26893 Other specified pregnancy related conditions, third trimester: Secondary | ICD-10-CM | POA: Insufficient documentation

## 2020-08-10 DIAGNOSIS — Z3689 Encounter for other specified antenatal screening: Secondary | ICD-10-CM

## 2020-08-10 DIAGNOSIS — O99013 Anemia complicating pregnancy, third trimester: Secondary | ICD-10-CM

## 2020-08-10 DIAGNOSIS — Z3A31 31 weeks gestation of pregnancy: Secondary | ICD-10-CM

## 2020-08-10 DIAGNOSIS — K5901 Slow transit constipation: Secondary | ICD-10-CM

## 2020-08-10 DIAGNOSIS — Z87891 Personal history of nicotine dependence: Secondary | ICD-10-CM | POA: Insufficient documentation

## 2020-08-10 DIAGNOSIS — R1032 Left lower quadrant pain: Secondary | ICD-10-CM | POA: Insufficient documentation

## 2020-08-10 NOTE — MAU Note (Signed)
Pt reports sharp pains in her left side all day today. Denies bleeding, dysuria. Reports good fetal movement.

## 2020-08-11 ENCOUNTER — Encounter: Payer: Self-pay | Admitting: Obstetrics & Gynecology

## 2020-08-11 ENCOUNTER — Telehealth (INDEPENDENT_AMBULATORY_CARE_PROVIDER_SITE_OTHER): Payer: Medicaid Other | Admitting: Obstetrics & Gynecology

## 2020-08-11 VITALS — BP 109/61 | HR 84 | Wt 179.9 lb

## 2020-08-11 DIAGNOSIS — R109 Unspecified abdominal pain: Secondary | ICD-10-CM | POA: Diagnosis not present

## 2020-08-11 DIAGNOSIS — O99891 Other specified diseases and conditions complicating pregnancy: Secondary | ICD-10-CM | POA: Diagnosis present

## 2020-08-11 DIAGNOSIS — Z3A31 31 weeks gestation of pregnancy: Secondary | ICD-10-CM | POA: Diagnosis not present

## 2020-08-11 DIAGNOSIS — K5901 Slow transit constipation: Secondary | ICD-10-CM | POA: Diagnosis not present

## 2020-08-11 DIAGNOSIS — O26893 Other specified pregnancy related conditions, third trimester: Secondary | ICD-10-CM | POA: Diagnosis not present

## 2020-08-11 DIAGNOSIS — O99013 Anemia complicating pregnancy, third trimester: Secondary | ICD-10-CM

## 2020-08-11 DIAGNOSIS — D509 Iron deficiency anemia, unspecified: Secondary | ICD-10-CM

## 2020-08-11 DIAGNOSIS — Z87891 Personal history of nicotine dependence: Secondary | ICD-10-CM | POA: Diagnosis not present

## 2020-08-11 DIAGNOSIS — O99613 Diseases of the digestive system complicating pregnancy, third trimester: Secondary | ICD-10-CM

## 2020-08-11 DIAGNOSIS — R1032 Left lower quadrant pain: Secondary | ICD-10-CM | POA: Diagnosis not present

## 2020-08-11 DIAGNOSIS — R102 Pelvic and perineal pain: Secondary | ICD-10-CM | POA: Diagnosis not present

## 2020-08-11 DIAGNOSIS — Z3403 Encounter for supervision of normal first pregnancy, third trimester: Secondary | ICD-10-CM

## 2020-08-11 LAB — CULTURE, OB URINE

## 2020-08-11 LAB — URINALYSIS, ROUTINE W REFLEX MICROSCOPIC
Bilirubin Urine: NEGATIVE
Glucose, UA: NEGATIVE mg/dL
Ketones, ur: NEGATIVE mg/dL
Nitrite: NEGATIVE
Protein, ur: 30 mg/dL — AB
RBC / HPF: >50 RBC/hpf — ABNORMAL HIGH (ref 0–5)
Specific Gravity, Urine: 1.027 (ref 1.005–1.030)
pH: 6 (ref 5.0–8.0)

## 2020-08-11 MED ORDER — FERROUS SULFATE 325 (65 FE) MG PO TABS: 325.0000 mg | ORAL_TABLET | ORAL | 3 refills | Status: DC

## 2020-08-11 NOTE — Progress Notes (Signed)
   OBSTETRICS PRENATAL VIRTUAL VISIT ENCOUNTER NOTE  Provider location: [facility] [place]   I connected with [name] on 08/11/20 at 10:15 AM EDT by MyChart Video Encounter at home and verified that I am speaking with the correct person using two identifiers.   I discussed the limitations, risks, security and privacy concerns of performing an evaluation and management service virtually and the availability of in person appointments. I also discussed with the patient that there may be a patient responsible charge related to this service. The patient expressed understanding and agreed to proceed. Subjective:  Rebecca Bean is a 19 y.o. G1P0000 at [redacted]w[redacted]d being seen today for ongoing prenatal care.  She is currently monitored for the following issues for this low-risk pregnancy and has Supervision of normal first pregnancy; Morning sickness; Gonorrhea in pregnancy, antepartum, first trimester; Chlamydia trachomatis infection in pregnancy in first trimester; Group B Streptococcus carrier, +RV culture, currently pregnant; Alpha thalassemia silent carrier; and Maternal iron deficiency anemia complicating pregnancy in third trimester on their problem list.  Patient reports no complaints. Seen in MAu last night for left round ligament pain, had reassuring FHR tracing. No other complaints.  Contractions: Not present. Vag. Bleeding: None.  Movement: Present. Denies any leaking of fluid.   The following portions of the patient's history were reviewed and updated as appropriate: allergies, current medications, past family history, past medical history, past social history, past surgical history and problem list.   Objective:   Vitals:   08/11/20 1015  BP: 109/61  Pulse: 84  Weight: 179 lb 14.3 oz (81.6 kg)    Fetal Status:     Movement: Present     General:  Alert, oriented and cooperative. Patient is in no acute distress.  Respiratory: Normal respiratory effort, no  problems with respiration noted  Mental Status: Normal mood and affect. Normal behavior. Normal judgment and thought content.  Rest of physical exam deferred due to type of encounter   Assessment and Plan:  Pregnancy: G1P0000 at [redacted]w[redacted]d 1. [redacted] weeks gestation of pregnancy 2. Encounter for supervision of normal first pregnancy in third trimester Declined Flu and COVID vaccines, will get Tdap next visit.  Preterm labor symptoms and general obstetric precautions including but not limited to vaginal bleeding, contractions, leaking of fluid and fetal movement were reviewed in detail with the patient. I discussed the assessment and treatment plan with the patient. The patient was provided an opportunity to ask questions and all were answered. The patient agreed with the plan and demonstrated an understanding of the instructions. The patient was advised to call back or seek an in-person office evaluation/go to MAU at [facility] for any urgent or concerning symptoms. Please refer to After Visit Summary for other counseling recommendations.   I provided 8 minutes of face-to-face time during this encounter.  Return in about 2 weeks (around 08/25/2020) for OFFICE OB Visit, TDap.   Jaynie Collins, MD [facility] for [employer], [facility]

## 2020-08-11 NOTE — Patient Instructions (Signed)
Return to office for any scheduled appointments. Call the office or go to the MA[facility] at [facility] [place] if:  You begin to have strong, frequent contractions  Your water breaks.  Sometimes it is a big gush of fluid, sometimes it is just a trickle that keeps getting your panties wet or running down your legs  You have vaginal bleeding.  It is normal to have a small amount of spotting if your cervix was checked.   You do not feel your baby moving like normal.  If you do not, get something to eat and drink and lay down and focus on feeling your baby move.   If your baby is still not moving like normal, you should call the office or go to MAU.  Any other obstetric concerns.  TDaP Vaccine Pregnancy Get the Whooping Cough Vaccine While You Are Pregnant (CDC)  It is important for women to get the whooping cough vaccine in the third trimester of each pregnancy. Vaccines are the best way to prevent this disease. There are 2 different whooping cough vaccines. Both vaccines combine protection against whooping cough, tetanus and diphtheria, but they are for different age groups: Tdap: for everyone 11 years or older, including pregnant women  DTaP: for children 2 months through 6 years of age  You need the whooping cough vaccine during each of your pregnancies The recommended time to get the shot is during your 27th through 36th week of pregnancy, preferably during the earlier part of this time period. The Centers for Disease Control and Prevention (CDC) recommends that pregnant women receive the whooping cough vaccine for adolescents and adults (called Tdap vaccine) during the third trimester of each pregnancy. The recommended time to get the shot is during your 27th through 36th week of pregnancy, preferably during the earlier part of this time period. This replaces the original recommendation that pregnant women get the vaccine only if they had not previously received it. The  American College of Obstetricians and Gynecologists and the American College of Nurse-Midwives support this recommendation.  You should get the whooping cough vaccine while pregnant to pass protection to your baby frame support disabled and/or not supported in this browser  Learn why Laura decided to get the whooping cough vaccine in her 3rd trimester of pregnancy and how her baby girl was born with some protection against the disease. Also available on YouTube. After receiving the whooping cough vaccine, your body will create protective antibodies (proteins produced by the body to fight off diseases) and pass some of them to your baby before birth. These antibodies provide your baby some short-term protection against whooping cough in early life. These antibodies can also protect your baby from some of the more serious complications that come along with whooping cough. Your protective antibodies are at their highest about 2 weeks after getting the vaccine, but it takes time to pass them to your baby. So the preferred time to get the whooping cough vaccine is early in your third trimester. The amount of whooping cough antibodies in your body decreases over time. That is why CDC recommends you get a whooping cough vaccine during each pregnancy. Doing so allows each of your babies to get the greatest number of protective antibodies from you. This means each of your babies will get the best protection possible against this disease.  Getting the whooping cough vaccine while pregnant is better than getting the vaccine after you give birth Whooping cough vaccination during   pregnancy is ideal so your baby will have short-term protection as soon as he is born. This early protection is important because your baby will not start getting his whooping cough vaccines until he is 2 months old. These first few months of life are when your baby is at greatest risk for catching whooping cough. This is also when he's at  greatest risk for having severe, potentially life-threating complications from the infection. To avoid that gap in protection, it is best to get a whooping cough vaccine during pregnancy. You will then pass protection to your baby before he is born. To continue protecting your baby, he should get whooping cough vaccines starting at 2 months old. You may never have gotten the Tdap vaccine before and did not get it during this pregnancy. If so, you should make sure to get the vaccine immediately after you give birth, before leaving the hospital or birthing center. It will take about 2 weeks before your body develops protection (antibodies) in response to the vaccine. Once you have protection from the vaccine, you are less likely to give whooping cough to your newborn while caring for him. But remember, your baby will still be at risk for catching whooping cough from others. A recent study looked to see how effective Tdap was at preventing whooping cough in babies whose mothers got the vaccine while pregnant or in the hospital after giving birth. The study found that getting Tdap between 27 through 36 weeks of pregnancy is 85% more effective at preventing whooping cough in babies younger than 2 months old. Blood tests cannot tell if you need a whooping cough vaccine There are no blood tests that can tell you if you have enough antibodies in your body to protect yourself or your baby against whooping cough. Even if you have been sick with whooping cough in the past or previously received the vaccine, you still should get the vaccine during each pregnancy. Breastfeeding may pass some protective antibodies onto your baby By breastfeeding, you may pass some antibodies you have made in response to the vaccine to your baby. When you get a whooping cough vaccine during your pregnancy, you will have antibodies in your breast milk that you can share with your baby as soon as your milk comes in. However, your baby will not  get protective antibodies immediately if you wait to get the whooping cough vaccine until after delivering your baby. This is because it takes about 2 weeks for your body to create antibodies. Learn more about the health benefits of breastfeeding.  

## 2020-08-11 NOTE — MAU Provider Note (Signed)
History     CSN: [id number]  Arrival date and time: 08/10/20 2332   First Provider Initiated Contact with Patient 08/11/20 0014      Chief Complaint  Patient presents with  . Abdominal Pain   19 y.o. G1 @31 .6 wks presenting with LLQ pain. Pain started today. Describes as constant and pulling. Pain is worse with fetal and maternal movements. Rates pain 3/10. Has not taken anything for it. Denies urinary sx. Denies VB or discharge. Reports small hard BM yesterday. +FM.   OB History    Gravida  1   Para  0   Term  0   Preterm  0   AB  0   Living        SAB  0   TAB  0   Ectopic  0   Multiple      Live Births              Past Medical History:  Diagnosis Date  . Heart murmur     Past Surgical History:  Procedure Laterality Date  . NO PAST SURGERIES      Family History  Problem Relation Age of Onset  . Hypertension Maternal Grandmother     Social History   Tobacco Use  . Smoking status: Former Research scientist (life sciences)  . Smokeless tobacco: Never Used  Vaping Use  . Vaping Use: Never used  Substance Use Topics  . Alcohol use: No  . Drug use: No    Allergies: No Known Allergies  Medications Prior to Admission  Medication Sig Dispense Refill Last Dose  . Blood Pressure Monitoring (BLOOD PRESSURE KIT) DEVI 1 kit by Does not apply route once a week. Check Blood Pressure regularly and record readings into the Babyscripts App.  Large Cuff.  DX O90.0 1 each 0   . docusate sodium (COLACE) 100 MG capsule Take 1 capsule (100 mg total) by mouth 2 (two) times daily as needed for mild constipation or moderate constipation. 30 capsule 2   . Prenatal Vit-Fe Phos-FA-Omega (VITAFOL GUMMIES) 3.33-0.333-34.8 MG CHEW Chew 3 tablets by mouth daily. 90 tablet 3   . [DISCONTINUED] ferrous sulfate (FERROUSUL) 325 (65 FE) MG tablet Take 1 tablet (325 mg total) by mouth 2 (two) times daily. 60 tablet 3     Review of Systems  Gastrointestinal: Positive for abdominal pain and  constipation. Negative for diarrhea, nausea and vomiting.  Genitourinary: Negative for dysuria, frequency, urgency, vaginal bleeding and vaginal discharge.  Musculoskeletal: Negative for back pain.   Physical Exam   Blood pressure 109/61, pulse 84, temperature 98.2 F (36.8 C), resp. rate 16, height 5' 6"  (1.676 m), weight 81.6 kg, last menstrual period 12/12/2019, SpO2 100 %.  Physical Exam Vitals and nursing note reviewed. Exam conducted with a chaperone present.  Constitutional:      General: She is not in acute distress (appears comfortable).    Appearance: Normal appearance.  HENT:     Head: Normocephalic and atraumatic.  Cardiovascular:     Rate and Rhythm: Normal rate.  Pulmonary:     Effort: Pulmonary effort is normal. No respiratory distress.  Abdominal:     Palpations: Abdomen is soft.     Tenderness: There is no abdominal tenderness. There is no guarding or rebound.     Comments: gravid  Genitourinary:    Comments: VE: closed/long Musculoskeletal:        General: Normal range of motion.     Cervical back: Normal range of motion.  Skin:  General: Skin is warm and dry.  Neurological:     General: No focal deficit present.     Mental Status: She is alert and oriented to person, place, and time.  Psychiatric:        Mood and Affect: Mood normal.        Behavior: Behavior normal.   EFM: 145 bpm, mod variability, + accels, no decels Toco: UI  Results for orders placed or performed during the hospital encounter of 08/10/20 (from the past 24 hour(s))  Urinalysis, Routine w reflex microscopic Urine, Clean Catch     Status: Abnormal   Collection Time: 08/10/20 11:50 PM  Result Value Ref Range   Color, Urine YELLOW YELLOW   APPearance CLOUDY (A) CLEAR   Specific Gravity, Urine 1.027 1.005 - 1.030   pH 6.0 5.0 - 8.0   Glucose, UA NEGATIVE NEGATIVE mg/dL   Hgb urine dipstick MODERATE (A) NEGATIVE   Bilirubin Urine NEGATIVE NEGATIVE   Ketones, ur NEGATIVE NEGATIVE  mg/dL   Protein, ur 30 (A) NEGATIVE mg/dL   Nitrite NEGATIVE NEGATIVE   Leukocytes,Ua SMALL (A) NEGATIVE   RBC / HPF >50 (H) 0 - 5 RBC/hpf   WBC, UA 11-20 0 - 5 WBC/hpf   Bacteria, UA RARE (A) NONE SEEN   Squamous Epithelial / LPF 6-10 0 - 5   Mucus PRESENT    MAU Course  Procedures  MDM Labs ordered and reviewed. No signs of PTL. Pain likely RL and/or constipation. Discussed treatment for both. UA with hgb and leuks, UC ordered. Stable for discharge home.   Assessment and Plan   1. [redacted] weeks gestation of pregnancy   2. Maternal iron deficiency anemia complicating pregnancy in third trimester   3. NST (non-stress test) reactive   4. Slow transit constipation   5. Round ligament pain    Discharge home Follow up at [facility] as scheduled Tylenol/heating pad prn PTL precautions  Allergies as of 08/11/2020   No Known Allergies     Medication List    TAKE these medications   Blood Pressure Kit Devi 1 kit by Does not apply route once a week. Check Blood Pressure regularly and record readings into the Babyscripts App.  Large Cuff.  DX O90.0   docusate sodium 100 MG capsule Commonly known as: COLACE Take 1 capsule (100 mg total) by mouth 2 (two) times daily as needed for mild constipation or moderate constipation.   ferrous sulfate 325 (65 FE) MG tablet Commonly known as: FerrouSul Take 1 tablet (325 mg total) by mouth every other day. What changed: when to take this   Vitafol Gummies 3.33-0.333-34.8 MG Chew Chew 3 tablets by mouth daily.      Julianne Handler, CNM 08/11/2020, 12:59 AM

## 2020-08-11 NOTE — Discharge Instructions (Signed)
Constipation, Adult Constipation is when a person:  Poops (has a bowel movement) fewer times in a week than normal.  Has a hard time pooping.  Has poop that is dry, hard, or bigger than normal. Follow these instructions at home: Eating and drinking   Eat foods that have a lot of fiber, such as: ? Fresh fruits and vegetables. ? Whole grains. ? Beans.  Eat less of foods that are high in fat, low in fiber, or overly processed, such as: ? [place] fries. ? Hamburgers. ? Cookies. ? Candy. ? Soda.  Drink enough fluid to keep your pee (urine) clear or pale yellow. General instructions  Exercise regularly or as told by your doctor.  Go to the restroom when you feel like you need to poop. Do not hold it in.  Take over-the-counter and prescription medicines only as told by your doctor. These include any fiber supplements.  Do pelvic floor retraining exercises, such as: ? Doing deep breathing while relaxing your lower belly (abdomen). ? Relaxing your pelvic floor while pooping.  Watch your condition for any changes.  Keep all follow-up visits as told by your doctor. This is important. Contact a doctor if:  You have pain that gets worse.  You have a fever.  You have not pooped for 4 days.  You throw up (vomit).  You are not hungry.  You lose weight.  You are bleeding from the anus.  You have thin, pencil-like poop (stool). Get help right away if:  You have a fever, and your symptoms suddenly get worse.  You leak poop or have blood in your poop.  Your belly feels hard or bigger than normal (is bloated).  You have very bad belly pain.  You feel dizzy or you faint. This information is not intended to replace advice given to you by your health care provider. Make sure you discuss any questions you have with your health care provider. Document Revised: 10/20/2017 Document Reviewed: 04/27/2016 Elsevier Patient Education  2020 Elsevier Inc.   Round Ligament  Pain  The round ligament is a cord of muscle and tissue that helps support the uterus. It can become a source of pain during pregnancy if it becomes stretched or twisted as the baby grows. The pain usually begins in the second trimester (13-28 weeks) of pregnancy, and it can come and go until the baby is delivered. It is not a serious problem, and it does not cause harm to the baby. Round ligament pain is usually a short, sharp, and pinching pain, but it can also be a dull, lingering, and aching pain. The pain is felt in the lower side of the abdomen or in the groin. It usually starts deep in the groin and moves up to the outside of the hip area. The pain may occur when you:  Suddenly change position, such as quickly going from a sitting to standing position.  Roll over in bed.  Cough or sneeze.  Do physical activity. Follow these instructions at home:   Watch your condition for any changes.  When the pain starts, relax. Then try any of these methods to help with the pain: ? Sitting down. ? Flexing your knees up to your abdomen. ? Lying on your side with one pillow under your abdomen and another pillow between your legs. ? Sitting in a warm bath for 15-20 minutes or until the pain goes away.  Take over-the-counter and prescription medicines only as told by your health care provider.  Move  when you sit down or stand up.  Avoid long walks if they cause pain.  Stop or reduce your physical activities if they cause pain.  Keep all follow-up visits as told by your health care provider. This is important. Contact a health care provider if:  Your pain does not go away with treatment.  You feel pain in your back that you did not have before.  Your medicine is not helping. Get help right away if:  You have a fever or chills.  You develop uterine contractions.  You have vaginal bleeding.  You have nausea or vomiting.  You have diarrhea.  You have pain when you  urinate. Summary  Round ligament pain is felt in the lower abdomen or groin. It is usually a short, sharp, and pinching pain. It can also be a dull, lingering, and aching pain.  This pain usually begins in the second trimester (13-28 weeks). It occurs because the uterus is stretching with the growing baby, and it is not harmful to the baby.  You may notice the pain when you suddenly change position, when you cough or sneeze, or during physical activity.  Relaxing, flexing your knees to your abdomen, lying on one side, or taking a warm bath may help to get rid of the pain.  Get help from your health care provider if the pain does not go away or if you have vaginal bleeding, nausea, vomiting, diarrhea, or painful urination. This information is not intended to replace advice given to you by your health care provider. Make sure you discuss any questions you have with your health care provider. Document Revised: 04/25/2018 Document Reviewed: 04/25/2018 Elsevier Patient Education  2020 Elsevier Inc.  

## 2020-08-25 ENCOUNTER — Other Ambulatory Visit: Payer: Self-pay

## 2020-08-25 ENCOUNTER — Ambulatory Visit (INDEPENDENT_AMBULATORY_CARE_PROVIDER_SITE_OTHER): Payer: Medicaid Other | Admitting: Advanced Practice Midwife

## 2020-08-25 VITALS — BP 112/70 | HR 87 | Wt 184.0 lb

## 2020-08-25 DIAGNOSIS — Z3403 Encounter for supervision of normal first pregnancy, third trimester: Secondary | ICD-10-CM

## 2020-08-25 DIAGNOSIS — D509 Iron deficiency anemia, unspecified: Secondary | ICD-10-CM

## 2020-08-25 DIAGNOSIS — O98311 Other infections with a predominantly sexual mode of transmission complicating pregnancy, first trimester: Secondary | ICD-10-CM

## 2020-08-25 DIAGNOSIS — A568 Sexually transmitted chlamydial infection of other sites: Secondary | ICD-10-CM

## 2020-08-25 DIAGNOSIS — O99013 Anemia complicating pregnancy, third trimester: Secondary | ICD-10-CM

## 2020-08-25 DIAGNOSIS — O98211 Gonorrhea complicating pregnancy, first trimester: Secondary | ICD-10-CM

## 2020-08-25 DIAGNOSIS — Z3A33 33 weeks gestation of pregnancy: Secondary | ICD-10-CM

## 2020-08-25 NOTE — Progress Notes (Signed)
   PRENATAL VISIT NOTE  Subjective:  Rebecca Bean is a 19 y.o. G1P0000 at [redacted]w[redacted]d being seen today for ongoing prenatal care.  She is currently monitored for the following issues for this low-risk pregnancy and has Supervision of normal first pregnancy; Morning sickness; Gonorrhea in pregnancy, antepartum, first trimester; Chlamydia trachomatis infection in pregnancy in first trimester; Group B Streptococcus carrier, +RV culture, currently pregnant; Alpha thalassemia silent carrier; and Maternal iron deficiency anemia complicating pregnancy in third trimester on their problem list.  Patient reports no complaints.  Contractions: Not present. Vag. Bleeding: None.  Movement: Present. Denies leaking of fluid.   The following portions of the patient's history were reviewed and updated as appropriate: allergies, current medications, past family history, past medical history, past social history, past surgical history and problem list.   Objective:   Vitals:   08/25/20 1118  BP: 112/70  Pulse: 87  Weight: 184 lb (83.5 kg)    Fetal Status:     Movement: Present     General:  Alert, oriented and cooperative. Patient is in no acute distress.  Skin: Skin is warm and dry. No rash noted.   Cardiovascular: Normal heart rate noted  Respiratory: Normal respiratory effort, no problems with respiration noted  Abdomen: Soft, gravid, appropriate for gestational age.  Pain/Pressure: Absent     Pelvic: Cervical exam deferred        Extremities: Normal range of motion.     Mental Status: Normal mood and affect. Normal behavior. Normal judgment and thought content.   Assessment and Plan:  Pregnancy: G1P0000 at [redacted]w[redacted]d 1. Encounter for supervision of normal first pregnancy in third trimester --Anticipatory guidance about next visits/weeks of pregnancy given.   --Next visit in 2-3 weeks for GC. GBS + in  Urine.   2. Maternal iron deficiency anemia complicating pregnancy in third trimester --Taking oral  iron, no s/sx of anemia  3. Chlamydia trachomatis infection in pregnancy in first trimester --Neg TOC, retest 36 weeks  4. Gonorrhea in pregnancy, antepartum, first trimester --Neg TOC, retest 36 weeks  5. [redacted] weeks gestation of pregnancy   Preterm labor symptoms and general obstetric precautions including but not limited to vaginal bleeding, contractions, leaking of fluid and fetal movement were reviewed in detail with the patient. Please refer to After Visit Summary for other counseling recommendations.   No follow-ups on file.  No future appointments.  Sharen Counter, CNM

## 2020-08-25 NOTE — Patient Instructions (Signed)
Third Trimester of Pregnancy The third trimester is from week 28 through week 40 (months 7 through 9). The third trimester is a time when the unborn baby (fetus) is growing rapidly. At the end of the ninth month, the fetus is about 20 inches in length and weighs 6-10 pounds. Body changes during your third trimester Your body will continue to go through many changes during pregnancy. The changes vary from woman to woman. During the third trimester:  Your weight will continue to increase. You can expect to gain 25-35 pounds (11-16 kg) by the end of the pregnancy.  You may begin to get stretch marks on your hips, abdomen, and breasts.  You may urinate more often because the fetus is moving lower into your pelvis and pressing on your bladder.  You may develop or continue to have heartburn. This is caused by increased hormones that slow down muscles in the digestive tract.  You may develop or continue to have constipation because increased hormones slow digestion and cause the muscles that push waste through your intestines to relax.  You may develop hemorrhoids. These are swollen veins (varicose veins) in the rectum that can itch or be painful.  You may develop swollen, bulging veins (varicose veins) in your legs.  You may have increased body aches in the pelvis, back, or thighs. This is due to weight gain and increased hormones that are relaxing your joints.  You may have changes in your hair. These can include thickening of your hair, rapid growth, and changes in texture. Some women also have hair loss during or after pregnancy, or hair that feels dry or thin. Your hair will most likely return to normal after your baby is born.  Your breasts will continue to grow and they will continue to become tender. A yellow fluid (colostrum) may leak from your breasts. This is the first milk you are producing for your baby.  Your belly button may stick out.  You may notice more swelling in your hands,  face, or ankles.  You may have increased tingling or numbness in your hands, arms, and legs. The skin on your belly may also feel numb.  You may feel short of breath because of your expanding uterus.  You may have more problems sleeping. This can be caused by the size of your belly, increased need to urinate, and an increase in your body's metabolism.  You may notice the fetus "dropping," or moving lower in your abdomen (lightening).  You may have increased vaginal discharge.  You may notice your joints feel loose and you may have pain around your pelvic bone. What to expect at prenatal visits You will have prenatal exams every 2 weeks until week 36. Then you will have weekly prenatal exams. During a routine prenatal visit:  You will be weighed to make sure you and the baby are growing normally.  Your blood pressure will be taken.  Your abdomen will be measured to track your baby's growth.  The fetal heartbeat will be listened to.  Any test results from the previous visit will be discussed.  You may have a cervical check near your due date to see if your cervix has softened or thinned (effaced).  You will be tested for Group B streptococcus. This happens between 35 and 37 weeks. Your health care provider may ask you:  What your birth plan is.  How you are feeling.  If you are feeling the baby move.  If you have had any abnormal   symptoms, such as leaking fluid, bleeding, severe headaches, or abdominal cramping.  If you are using any tobacco products, including cigarettes, chewing tobacco, and electronic cigarettes.  If you have any questions. Other tests or screenings that may be performed during your third trimester include:  Blood tests that check for low iron levels (anemia).  Fetal testing to check the health, activity level, and growth of the fetus. Testing is done if you have certain medical conditions or if there are problems during the pregnancy.  Nonstress test  (NST). This test checks the health of your baby to make sure there are no signs of problems, such as the baby not getting enough oxygen. During this test, a belt is placed around your belly. The baby is made to move, and its heart rate is monitored during movement. What is false labor? False labor is a condition in which you feel small, irregular tightenings of the muscles in the womb (contractions) that usually go away with rest, changing position, or drinking water. These are called Braxton Hicks contractions. Contractions may last for hours, days, or even weeks before true labor sets in. If contractions come at regular intervals, become more frequent, increase in intensity, or become painful, you should see your health care provider. What are the signs of labor?  Abdominal cramps.  Regular contractions that start at 10 minutes apart and become stronger and more frequent with time.  Contractions that start on the top of the uterus and spread down to the lower abdomen and back.  Increased pelvic pressure and dull back pain.  A watery or bloody mucus discharge that comes from the vagina.  Leaking of amniotic fluid. This is also known as your "water breaking." It could be a slow trickle or a gush. Let your health care provider know if it has a color or strange odor. If you have any of these signs, call your health care provider right away, even if it is before your due date. Follow these instructions at home: Medicines  Follow your health care provider's instructions regarding medicine use. Specific medicines may be either safe or unsafe to take during pregnancy.  Take a prenatal vitamin that contains at least 600 micrograms (mcg) of folic acid.  If you develop constipation, try taking a stool softener if your health care provider approves. Eating and drinking   Eat a balanced diet that includes fresh fruits and vegetables, whole grains, good sources of protein such as meat, eggs, or tofu,  and low-fat dairy. Your health care provider will help you determine the amount of weight gain that is right for you.  Avoid raw meat and uncooked cheese. These carry germs that can cause birth defects in the baby.  If you have low calcium intake from food, talk to your health care provider about whether you should take a daily calcium supplement.  Eat four or five small meals rather than three large meals a day.  Limit foods that are high in fat and processed sugars, such as fried and sweet foods.  To prevent constipation: ? Drink enough fluid to keep your urine clear or pale yellow. ? Eat foods that are high in fiber, such as fresh fruits and vegetables, whole grains, and beans. Activity  Exercise only as directed by your health care provider. Most women can continue their usual exercise routine during pregnancy. Try to exercise for 30 minutes at least 5 days a week. Stop exercising if you experience uterine contractions.  Avoid heavy lifting.  Do   not exercise in extreme heat or humidity, or at high altitudes.  Wear low-heel, comfortable shoes.  Practice good posture.  You may continue to have sex unless your health care provider tells you otherwise. Relieving pain and discomfort  Take frequent breaks and rest with your legs elevated if you have leg cramps or low back pain.  Take warm sitz baths to soothe any pain or discomfort caused by hemorrhoids. Use hemorrhoid cream if your health care provider approves.  Wear a good support bra to prevent discomfort from breast tenderness.  If you develop varicose veins: ? Wear support pantyhose or compression stockings as told by your healthcare provider. ? Elevate your feet for 15 minutes, 3-4 times a day. Prenatal care  Write down your questions. Take them to your prenatal visits.  Keep all your prenatal visits as told by your health care provider. This is important. Safety  Wear your seat belt at all times when driving.  Make  a list of emergency phone numbers, including numbers for family, friends, the hospital, and police and fire departments. General instructions  Avoid cat litter boxes and soil used by cats. These carry germs that can cause birth defects in the baby. If you have a cat, ask someone to clean the litter box for you.  Do not travel far distances unless it is absolutely necessary and only with the approval of your health care provider.  Do not use hot tubs, steam rooms, or saunas.  Do not drink alcohol.  Do not use any products that contain nicotine or tobacco, such as cigarettes and e-cigarettes. If you need help quitting, ask your health care provider.  Do not use any medicinal herbs or unprescribed drugs. These chemicals affect the formation and growth of the baby.  Do not douche or use tampons or scented sanitary pads.  Do not cross your legs for long periods of time.  To prepare for the arrival of your baby: ? Take prenatal classes to understand, practice, and ask questions about labor and delivery. ? Make a trial run to the hospital. ? Visit the hospital and tour the maternity area. ? Arrange for maternity or paternity leave through employers. ? Arrange for family and friends to take care of pets while you are in the hospital. ? Purchase a rear-facing car seat and make sure you know how to install it in your car. ? Pack your hospital bag. ? Prepare the baby's nursery. Make sure to remove all pillows and stuffed animals from the baby's crib to prevent suffocation.  Visit your dentist if you have not gone during your pregnancy. Use a soft toothbrush to brush your teeth and be gentle when you floss. Contact a health care provider if:  You are unsure if you are in labor or if your water has broken.  You become dizzy.  You have mild pelvic cramps, pelvic pressure, or nagging pain in your abdominal area.  You have lower back pain.  You have persistent nausea, vomiting, or  diarrhea.  You have an unusual or bad smelling vaginal discharge.  You have pain when you urinate. Get help right away if:  Your water breaks before 37 weeks.  You have regular contractions less than 5 minutes apart before 37 weeks.  You have a fever.  You are leaking fluid from your vagina.  You have spotting or bleeding from your vagina.  You have severe abdominal pain or cramping.  You have rapid weight loss or weight gain.  You have   shortness of breath with chest pain.  You notice sudden or extreme swelling of your face, hands, ankles, feet, or legs.  Your baby makes fewer than 10 movements in 2 hours.  You have severe headaches that do not go away when you take medicine.  You have vision changes. Summary  The third trimester is from week 28 through week 40, months 7 through 9. The third trimester is a time when the unborn baby (fetus) is growing rapidly.  During the third trimester, your discomfort may increase as you and your baby continue to gain weight. You may have abdominal, leg, and back pain, sleeping problems, and an increased need to urinate.  During the third trimester your breasts will keep growing and they will continue to become tender. A yellow fluid (colostrum) may leak from your breasts. This is the first milk you are producing for your baby.  False labor is a condition in which you feel small, irregular tightenings of the muscles in the womb (contractions) that eventually go away. These are called Braxton Hicks contractions. Contractions may last for hours, days, or even weeks before true labor sets in.  Signs of labor can include: abdominal cramps; regular contractions that start at 10 minutes apart and become stronger and more frequent with time; watery or bloody mucus discharge that comes from the vagina; increased pelvic pressure and dull back pain; and leaking of amniotic fluid. This information is not intended to replace advice given to you by your  health care provider. Make sure you discuss any questions you have with your health care provider. Document Revised: 02/28/2019 Document Reviewed: 12/13/2016 Elsevier Patient Education  2020 [employer]  

## 2020-09-15 ENCOUNTER — Ambulatory Visit (INDEPENDENT_AMBULATORY_CARE_PROVIDER_SITE_OTHER): Payer: Medicaid Other | Admitting: Women's Health

## 2020-09-15 ENCOUNTER — Other Ambulatory Visit (HOSPITAL_COMMUNITY)
Admission: RE | Admit: 2020-09-15 | Discharge: 2020-09-15 | Disposition: A | Discharge status: Home / Self Care | Payer: Medicaid Other | Source: Ambulatory Visit | Attending: Women's Health | Admitting: Women's Health

## 2020-09-15 ENCOUNTER — Other Ambulatory Visit: Payer: Self-pay

## 2020-09-15 VITALS — BP 113/70 | HR 71 | Wt 192.0 lb

## 2020-09-15 DIAGNOSIS — O98311 Other infections with a predominantly sexual mode of transmission complicating pregnancy, first trimester: Secondary | ICD-10-CM

## 2020-09-15 DIAGNOSIS — O99820 Streptococcus B carrier state complicating pregnancy: Secondary | ICD-10-CM

## 2020-09-15 DIAGNOSIS — Z3403 Encounter for supervision of normal first pregnancy, third trimester: Secondary | ICD-10-CM

## 2020-09-15 DIAGNOSIS — O99013 Anemia complicating pregnancy, third trimester: Secondary | ICD-10-CM

## 2020-09-15 DIAGNOSIS — A568 Sexually transmitted chlamydial infection of other sites: Secondary | ICD-10-CM

## 2020-09-15 DIAGNOSIS — D563 Thalassemia minor: Secondary | ICD-10-CM

## 2020-09-15 DIAGNOSIS — O98211 Gonorrhea complicating pregnancy, first trimester: Secondary | ICD-10-CM

## 2020-09-15 DIAGNOSIS — D509 Iron deficiency anemia, unspecified: Secondary | ICD-10-CM

## 2020-09-15 NOTE — Progress Notes (Signed)
ROB, reports no problems today. 

## 2020-09-15 NOTE — Progress Notes (Signed)
Subjective:  Skyelyn Scruggs is a 19 y.o. G1P0000 at [redacted]w[redacted]d being seen today for ongoing prenatal care.  She is currently monitored for the following issues for this low-risk pregnancy and has Supervision of normal first pregnancy; Gonorrhea in pregnancy, antepartum, first trimester; Chlamydia trachomatis infection in pregnancy in first trimester; Group B Streptococcus carrier, +RV culture, currently pregnant; Alpha thalassemia silent carrier; and Maternal iron deficiency anemia complicating pregnancy in third trimester on their problem list.  Patient reports no complaints.  Contractions: Not present. Vag. Bleeding: None.  Movement: Present. Denies leaking of fluid.   The following portions of the patient's history were reviewed and updated as appropriate: allergies, current medications, past family history, past medical history, past social history, past surgical history and problem list. Problem list updated.  Objective:   Vitals:   09/15/20 1037  BP: 113/70  Pulse: 71  Weight: 192 lb (87.1 kg)    Fetal Status: Fetal Heart Rate (bpm): 137 Fundal Height: 37 cm Movement: Present     General:  Alert, oriented and cooperative. Patient is in no acute distress.  Skin: Skin is warm and dry. No rash noted.   Cardiovascular: Normal heart rate noted  Respiratory: Normal respiratory effort, no problems with respiration noted  Abdomen: Soft, gravid, appropriate for gestational age. Pain/Pressure: Present     Pelvic: Vag. Bleeding: None     Cervical exam deferred        Extremities: Normal range of motion.  Edema: None  Mental Status: Normal mood and affect. Normal behavior. Normal judgment and thought content.   Urinalysis:      Assessment and Plan:  Pregnancy: G1P0000 at [redacted]w[redacted]d  1. Encounter for supervision of normal first pregnancy in third trimester - pt declines cervical exam - Cervicovaginal ancillary only( [place])  2. Maternal iron deficiency anemia complicating pregnancy in  third trimester - pt on oral iron  3. Alpha thalassemia silent carrier -pt reports had counseling with [facility]  4. Group B Streptococcus carrier, +RV culture, currently pregnant -discussed and info given  5. Gonorrhea in pregnancy, antepartum, first trimester  6. Chlamydia trachomatis infection in pregnancy in first trimester  Term labor symptoms and general obstetric precautions including but not limited to vaginal bleeding, contractions, leaking of fluid and fetal movement were reviewed in detail with the patient. I discussed the assessment and treatment plan with the patient. The patient was provided an opportunity to ask questions and all were answered. The patient agreed with the plan and demonstrated an understanding of the instructions. The patient was advised to call back or seek an in-person office evaluation/go to [facility] at [facility] for any urgent or concerning symptoms. Please refer to After Visit Summary for other counseling recommendations.  Return in about 1 week (around 09/22/2020) for virtual LOB/APP OK.   Waynesha Rammel, Odie Sera, NP

## 2020-09-15 NOTE — Patient Instructions (Signed)
Maternity Assessment Unit (MAU)  The Maternity Assessment Unit (MAU) is located at the [facility] at [facility]. The address is: [address], [place], [place], [place] [address]. Please see map below for additional directions.    The Maternity Assessment Unit is designed to help you during your pregnancy, and for up to 6 weeks after delivery, with any pregnancy- or postpartum-related emergencies, if you think you are in labor, or if your water has broken. For example, if you experience nausea and vomiting, vaginal bleeding, severe abdominal or pelvic pain, elevated blood pressure or other problems related to your pregnancy or postpartum time, please come to the Maternity Assessment Unit for assistance.        Group B Streptococcus Infection During Pregnancy Group B Streptococcus (GBS) is a type of bacteria that is often found in healthy people. It is commonly found in the rectum, vagina, and intestines. In people who are healthy and not pregnant, the bacteria rarely cause serious illness or complications. However, women who test positive for GBS during pregnancy can pass the bacteria to the baby during childbirth. This can cause serious infection in the baby after birth. Women with GBS may also have infections during their pregnancy or soon after childbirth. The infections include urinary tract infections (UTIs) or infections of the uterus. GBS also increases a woman's risk of complications during pregnancy, such as early labor or delivery, miscarriage, or stillbirth. Routine testing for GBS is recommended for all pregnant women. What are the causes? This condition is caused by bacteria called Streptococcus agalactiae. What increases the risk? You may have a higher risk for GBS infection during pregnancy if you had one during a past pregnancy. What are the signs or symptoms? In most cases, GBS infection does not cause symptoms in pregnant women. If symptoms  exist, they may include:  Labor that starts before the 37th week of pregnancy.  A UTI or bladder infection. This may cause a fever, frequent urination, or pain and burning during urination.  Fever during labor. There can also be a rapid heartbeat in the mother or baby. Rare but serious symptoms of a GBS infection in women include:  Blood infection (septicemia). This may cause fever, chills, or confusion.  Lung infection (pneumonia). This may cause fever, chills, cough, rapid breathing, chest pain, or difficulty breathing.  Bone, joint, skin, or soft tissue infection. How is this diagnosed? You may be screened for GBS between week 35 and week 37 of pregnancy. If you have symptoms of preterm labor, you may be screened earlier. This condition is diagnosed based on lab test results from:  A swab of fluid from the vagina and rectum.  A urine sample. How is this treated? This condition is treated with antibiotic medicine. Antibiotic medicine may be given:  To you when you go into labor, or as soon as your water breaks. The medicines will continue until after you give birth. If you are having a cesarean delivery, you do not need antibiotics unless your water has broken.  To your baby, if he or she requires treatment. Your health care provider will check your baby to decide if he or she needs antibiotics to prevent a serious infection. Follow these instructions at home:  Take over-the-counter and prescription medicines only as told by your health care provider.  Take your antibiotic medicine as told by your health care provider. Do not stop taking the antibiotic even if you start to feel better.  Keep all  pre-birth (prenatal) visits and follow-up visits as told by your health care provider. This is important. Contact a health care provider if:  You have pain or burning when you urinate.  You have to urinate more often than usual.  You have a fever or chills.  You develop a  bad-smelling vaginal discharge. Get help right away if:  Your water breaks.  You go into labor.  You have severe pain in your abdomen.  You have difficulty breathing.  You have chest pain. These symptoms may represent a serious problem that is an emergency. Do not wait to see if the symptoms will go away. Get medical help right away. Call your local emergency services (911 in the [place] Do not drive yourself to the hospital. Summary  GBS is a type of bacteria that is common in healthy people.  During pregnancy, colonization with GBS can cause serious complications for you or your baby.  Your health care provider will screen you between 35 and 37 weeks of pregnancy to determine if you are colonized with GBS.  If you are colonized with GBS during pregnancy, your health care provider will recommend antibiotics through an IV during labor.  After delivery, your baby will be evaluated for complications related to potential GBS infection and may require antibiotics to prevent a serious infection. This information is not intended to replace advice given to you by your health care provider. Make sure you discuss any questions you have with your health care provider. Document Revised: 06/03/2019 Document Reviewed: 06/03/2019 Elsevier Patient Education  2020 [employer].        Signs and Symptoms of Labor Labor is your body's natural process of moving your baby, placenta, and umbilical cord out of your uterus. The process of labor usually starts when your baby is full-term, between 21 and 40 weeks of pregnancy. How will I know when I am close to going into labor? As your body prepares for labor and the birth of your baby, you may notice the following symptoms in the weeks and days before true labor starts:  Having a strong desire to get your home ready to receive your new baby. This is called nesting. Nesting may be a sign that labor is approaching, and it may occur several weeks before  birth. Nesting may involve cleaning and organizing your home.  Passing a small amount of thick, bloody mucus out of your vagina (normal bloody show or losing your mucus plug). This may happen more than a week before labor begins, or it might occur right before labor begins as the opening of the cervix starts to widen (dilate). For some women, the entire mucus plug passes at once. For others, smaller portions of the mucus plug may gradually pass over several days.  Your baby moving (dropping) lower in your pelvis to get into position for birth (lightening). When this happens, you may feel more pressure on your bladder and pelvic bone and less pressure on your ribs. This may make it easier to breathe. It may also cause you to need to urinate more often and have problems with bowel movements.  Having "practice contractions" (Braxton Hicks contractions) that occur at irregular (unevenly spaced) intervals that are more than 10 minutes apart. This is also called false labor. False labor contractions are common after exercise or sexual activity, and they will stop if you change position, rest, or drink fluids. These contractions are usually mild and do not get stronger over time. They may feel like: ?  A backache or back pain. ? Mild cramps, similar to menstrual cramps. ? Tightening or pressure in your abdomen. Other early symptoms that labor may be starting soon include:  Nausea or loss of appetite.  Diarrhea.  Having a sudden burst of energy, or feeling very tired.  Mood changes.  Having trouble sleeping. How will I know when labor has begun? Signs that true labor has begun may include:  Having contractions that come at regular (evenly spaced) intervals and increase in intensity. This may feel like more intense tightening or pressure in your abdomen that moves to your back. ? Contractions may also feel like rhythmic pain in your upper thighs or back that comes and goes at regular intervals. ? For  first-time mothers, this change in intensity of contractions often occurs at a more gradual pace. ? Women who have given birth before may notice a more rapid progression of contraction changes.  Having a feeling of pressure in the vaginal area.  Your water breaking (rupture of membranes). This is when the sac of fluid that surrounds your baby breaks. When this happens, you will notice fluid leaking from your vagina. This may be clear or blood-tinged. Labor usually starts within 24 hours of your water breaking, but it may take longer to begin. ? Some women notice this as a gush of fluid. ? Others notice that their underwear repeatedly becomes damp. Follow these instructions at home:   When labor starts, or if your water breaks, call your health care provider or nurse care line. Based on your situation, they will determine when you should go in for an exam.  When you are in early labor, you may be able to rest and manage symptoms at home. Some strategies to try at home include: ? Breathing and relaxation techniques. ? Taking a warm bath or shower. ? Listening to music. ? Using a heating pad on the lower back for pain. If you are directed to use heat:  Place a towel between your skin and the heat source.  Leave the heat on for 20-30 minutes.  Remove the heat if your skin turns bright red. This is especially important if you are unable to feel pain, heat, or cold. You may have a greater risk of getting burned. Get help right away if:  You have painful, regular contractions that are 5 minutes apart or less.  Labor starts before you are [redacted] weeks along in your pregnancy.  You have a fever.  You have a headache that does not go away.  You have bright red blood coming from your vagina.  You do not feel your baby moving.  You have a sudden onset of: ? Severe headache with vision problems. ? Nausea, vomiting, or diarrhea. ? Chest pain or shortness of breath. These symptoms may be an  emergency. If your health care provider recommends that you go to the hospital or birth center where you plan to deliver, do not drive yourself. Have someone else drive you, or call emergency services (911 in the [place] Summary  Labor is your body's natural process of moving your baby, placenta, and umbilical cord out of your uterus.  The process of labor usually starts when your baby is full-term, between 78 and 40 weeks of pregnancy.  When labor starts, or if your water breaks, call your health care provider or nurse care line. Based on your situation, they will determine when you should go in for an exam. This information is not intended to replace  advice given to you by your health care provider. Make sure you discuss any questions you have with your health care provider. Document Revised: 08/07/2017 Document Reviewed: 04/14/2017 Elsevier Patient Education  2020 Elsevier Inc.        Ball CorporationBraxton Hicks Contractions Contractions of the uterus can occur throughout pregnancy, but they are not always a sign that you are in labor. You may have practice contractions called Braxton Hicks contractions. These false labor contractions are sometimes confused with true labor. What are Deberah PeltonBraxton Hicks contractions? Braxton Hicks contractions are tightening movements that occur in the muscles of the uterus before labor. Unlike true labor contractions, these contractions do not result in opening (dilation) and thinning of the cervix. Toward the end of pregnancy (32-34 weeks), Braxton Hicks contractions can happen more often and may become stronger. These contractions are sometimes difficult to tell apart from true labor because they can be very uncomfortable. You should not feel embarrassed if you go to the hospital with false labor. Sometimes, the only way to tell if you are in true labor is for your health care provider to look for changes in the cervix. The health care provider will do a physical exam and may  monitor your contractions. If you are not in true labor, the exam should show that your cervix is not dilating and your water has not broken. If there are no other health problems associated with your pregnancy, it is completely safe for you to be sent home with false labor. You may continue to have Braxton Hicks contractions until you go into true labor. How to tell the difference between true labor and false labor True labor  Contractions last 30-70 seconds.  Contractions become very regular.  Discomfort is usually felt in the top of the uterus, and it spreads to the lower abdomen and low back.  Contractions do not go away with walking.  Contractions usually become more intense and increase in frequency.  The cervix dilates and gets thinner. False labor  Contractions are usually shorter and not as strong as true labor contractions.  Contractions are usually irregular.  Contractions are often felt in the front of the lower abdomen and in the groin.  Contractions may go away when you walk around or change positions while lying down.  Contractions get weaker and are shorter-lasting as time goes on.  The cervix usually does not dilate or become thin. Follow these instructions at home:   Take over-the-counter and prescription medicines only as told by your health care provider.  Keep up with your usual exercises and follow other instructions from your health care provider.  Eat and drink lightly if you think you are going into labor.  If Braxton Hicks contractions are making you uncomfortable: ? Change your position from lying down or resting to walking, or change from walking to resting. ? Sit and rest in a tub of warm water. ? Drink enough fluid to keep your urine pale yellow. Dehydration may cause these contractions. ? Do slow and deep breathing several times an hour.  Keep all follow-up prenatal visits as told by your health care provider. This is important. Contact a  health care provider if:  You have a fever.  You have continuous pain in your abdomen. Get help right away if:  Your contractions become stronger, more regular, and closer together.  You have fluid leaking or gushing from your vagina.  You pass blood-tinged mucus (bloody show).  You have bleeding from your vagina.  You  have low back pain that you never had before.  You feel your baby's head pushing down and causing pelvic pressure.  Your baby is not moving inside you as much as it used to. Summary  Contractions that occur before labor are called Braxton Hicks contractions, false labor, or practice contractions.  Braxton Hicks contractions are usually shorter, weaker, farther apart, and less regular than true labor contractions. True labor contractions usually become progressively stronger and regular, and they become more frequent.  Manage discomfort from [facility] contractions by changing position, resting in a warm bath, drinking plenty of water, or practicing deep breathing. This information is not intended to replace advice given to you by your health care provider. Make sure you discuss any questions you have with your health care provider. Document Revised: 10/20/2017 Document Reviewed: 03/23/2017 Elsevier Patient Education  2020 Elsevier Inc.        Premature Rupture and Preterm Premature Rupture of Membranes  Rupture of membranes is when the membranes (amniotic sac) that hold your baby break open. This is commonly referred to as your "water breaking." If your water breaks before labor starts (prematurely), it is called premature rupture of membranes (PROM). If PROM occurs before 37 weeks of pregnancy, it is called preterm premature rupture of membranes (PPROM). Because the amniotic sac keeps infection out and performs other important functions, having the amniotic sac rupture before 37 weeks of pregnancy can lead to serious problems. It requires immediate attention  from a health care provider. What are the causes? When PROM occurs at 37 weeks of pregnancy or later, it is usually caused by natural weakening of the membranes and friction caused by contractions. PPROM is usually caused by infection. In many cases, the cause is not known. What increases the risk of PPROM? The following factors may make you more likely to have PPROM:  Infection.  Having had PPROM in a previous pregnancy.  Short cervical length.  Bleeding during the second or third trimester.  Low BMI, which is an estimate of body fat.  Smoking.  Using drugs.  Low socioeconomic status. What problems can be caused by PROM and PPROM? This condition creates health dangers for the mother and the baby. These include:  Delivering a premature baby.  Getting a serious infection of the placental tissues (chorioamnionitis).  Early detachment of the placenta from the uterus (placental abruption).  Compression of the umbilical cord.  Developing a serious infection after delivery. What are the signs of PROM and PPROM? Signs of this condition include:  A sudden gush or slow leaking of fluid from the vagina.  Constant wet underwear. Sometimes, women mistake the leaking or wetness for urine, especially if the leak is slow and not a gush of fluid. If there is constant leaking or if your underwear continues to get wet, your membranes have likely ruptured. What should I do if I think my membranes have ruptured?  Call your health care provider right away.  You will need to go to the hospital immediately to be checked by a health care provider. What happens if I am diagnosed with PROM or PPROM? Once you arrive at the hospital, you will have tests done. A cervical exam will be done using a lubricated instrument (speculum) to check whether the cervix has softened or started to open (dilate).  If you are diagnosed with PROM, your labor may be started for you (you may be induced) within 24  hours if you are not having contractions.  If you  are diagnosed with PPROM and you are not having contractions, you may be induced depending on your trimester. If you have PPROM:  You and your baby will be monitored closely for signs of infection or other complications.  You may be given: ? An antibiotic medicine to lower the chances of developing an infection. ? A steroid medicine to help mature the baby's lungs more quickly. ? A medicine to help prevent cerebral palsy in your baby. ? A medicine to stop preterm labor.  You may be ordered to be on bed rest at home or in the hospital.  You may be induced if complications occur for you or the baby. Your treatment will depend on many factors, such as how many weeks you have been pregnant (how far along you are), the development of the baby, and other complications that may occur. This information is not intended to replace advice given to you by your health care provider. Make sure you discuss any questions you have with your health care provider. Document Revised: 03/01/2019 Document Reviewed: 06/13/2016 Elsevier Patient Education  2020 [employer].

## 2020-09-16 LAB — CERVICOVAGINAL ANCILLARY ONLY
Chlamydia: NEGATIVE
Comment: NEGATIVE
Comment: NORMAL
Neisseria Gonorrhea: NEGATIVE

## 2020-09-22 ENCOUNTER — Telehealth (INDEPENDENT_AMBULATORY_CARE_PROVIDER_SITE_OTHER): Payer: Medicaid Other | Admitting: Advanced Practice Midwife

## 2020-09-22 ENCOUNTER — Inpatient Hospital Stay (HOSPITAL_COMMUNITY): Admit: 2020-09-22 | Payer: Self-pay

## 2020-09-22 DIAGNOSIS — D509 Iron deficiency anemia, unspecified: Secondary | ICD-10-CM

## 2020-09-22 DIAGNOSIS — Z3A37 37 weeks gestation of pregnancy: Secondary | ICD-10-CM

## 2020-09-22 DIAGNOSIS — Z3403 Encounter for supervision of normal first pregnancy, third trimester: Secondary | ICD-10-CM

## 2020-09-22 DIAGNOSIS — O99013 Anemia complicating pregnancy, third trimester: Secondary | ICD-10-CM

## 2020-09-22 NOTE — Progress Notes (Signed)
Pt states she is having increase in pressure and cramping.  Pt cannot take BP for today's visit.

## 2020-09-22 NOTE — Progress Notes (Signed)
   OBSTETRICS PRENATAL VIRTUAL VISIT ENCOUNTER NOTE  Provider location: [facility] [place]   I connected with Rebecca Bean on 09/22/20 at  4:00 PM EDT by MyChart Video Encounter at home and verified that I am speaking with the correct person using two identifiers.   I discussed the limitations, risks, security and privacy concerns of performing an evaluation and management service virtually and the availability of in person appointments. I also discussed with the patient that there may be a patient responsible charge related to this service. The patient expressed understanding and agreed to proceed. Subjective:  Rebecca Bean is a 19 y.o. G1P0000 at [redacted]w[redacted]d being seen today for ongoing prenatal care.  She is currently monitored for the following issues for this low-risk pregnancy and has Supervision of normal first pregnancy; Gonorrhea in pregnancy, antepartum, first trimester; Chlamydia trachomatis infection in pregnancy in first trimester; Group B Streptococcus carrier, +RV culture, currently pregnant; Alpha thalassemia silent carrier; and Maternal iron deficiency anemia complicating pregnancy in third trimester on their problem list.  Patient reports occasional contractions.  Contractions: Irritability. Vag. Bleeding: None.  Movement: Present. Denies any leaking of fluid.   The following portions of the patient's history were reviewed and updated as appropriate: allergies, current medications, past family history, past medical history, past social history, past surgical history and problem list.   Objective:  There were no vitals filed for this visit.  Fetal Status:     Movement: Present     General:  Alert, oriented and cooperative. Patient is in no acute distress.  Respiratory: Normal respiratory effort, no problems with respiration noted  Mental Status: Normal mood and affect. Normal behavior. Normal judgment and thought content.  Rest of physical exam deferred  due to type of encounter  Imaging: No results found.  Assessment and Plan:  Pregnancy: G1P0000 at [redacted]w[redacted]d 1. Maternal iron deficiency anemia complicating pregnancy in third trimester --Taking oral iron plus PNV, no s/sx of anemia  2. Encounter for supervision of normal first pregnancy in third trimester --Anticipatory guidance about next visits/weeks of pregnancy given. --Pt reports good fetal movement, denies regular contractions,, LOF, or vaginal bleeding --Next visit in 1 week, in office  3. [redacted] weeks gestation of pregnancy   Term labor symptoms and general obstetric precautions including but not limited to vaginal bleeding, contractions, leaking of fluid and fetal movement were reviewed in detail with the patient. I discussed the assessment and treatment plan with the patient. The patient was provided an opportunity to ask questions and all were answered. The patient agreed with the plan and demonstrated an understanding of the instructions. The patient was advised to call back or seek an in-person office evaluation/go to [facility] at [facility] for any urgent or concerning symptoms. Please refer to After Visit Summary for other counseling recommendations.   I provided 10 minutes of face-to-face time during this encounter.  Return in about 1 week (around 09/29/2020).  Future Appointments  Date Time Provider Department Center  09/29/2020  3:20 PM Currie Paris, NP [facility] None  10/06/2020  3:40 PM Leftwich-Kirby, Wilmer Floor, CNM [facility] None    Sharen Counter, CNM [facility] for [employer], [facility]

## 2020-09-25 ENCOUNTER — Other Ambulatory Visit: Payer: Self-pay

## 2020-09-25 ENCOUNTER — Encounter (HOSPITAL_COMMUNITY): Payer: Self-pay | Admitting: Obstetrics & Gynecology

## 2020-09-25 ENCOUNTER — Inpatient Hospital Stay (HOSPITAL_COMMUNITY)
Admission: AD | Admit: 2020-09-25 | Discharge: 2020-09-25 | Disposition: A | Discharge status: Home / Self Care | Payer: Medicaid Other | Attending: Obstetrics & Gynecology | Admitting: Obstetrics & Gynecology

## 2020-09-25 DIAGNOSIS — Z3A38 38 weeks gestation of pregnancy: Secondary | ICD-10-CM | POA: Diagnosis not present

## 2020-09-25 DIAGNOSIS — O471 False labor at or after 37 completed weeks of gestation: Secondary | ICD-10-CM | POA: Diagnosis not present

## 2020-09-25 DIAGNOSIS — D509 Iron deficiency anemia, unspecified: Secondary | ICD-10-CM

## 2020-09-25 LAB — URINALYSIS, ROUTINE W REFLEX MICROSCOPIC
Bacteria, UA: NONE SEEN
Bilirubin Urine: NEGATIVE
Glucose, UA: NEGATIVE mg/dL
Hgb urine dipstick: NEGATIVE
Ketones, ur: NEGATIVE mg/dL
Leukocytes,Ua: NEGATIVE
Nitrite: NEGATIVE
Protein, ur: 30 mg/dL — AB
Specific Gravity, Urine: 1.028 (ref 1.005–1.030)
pH: 6 (ref 5.0–8.0)

## 2020-09-25 NOTE — Discharge Instructions (Signed)
Braxton Hicks Contractions Contractions of the uterus can occur throughout pregnancy, but they are not always a sign that you are in labor. You may have practice contractions called Braxton Hicks contractions. These false labor contractions are sometimes confused with true labor. What are Braxton Hicks contractions? Braxton Hicks contractions are tightening movements that occur in the muscles of the uterus before labor. Unlike true labor contractions, these contractions do not result in opening (dilation) and thinning of the cervix. Toward the end of pregnancy (32-34 weeks), Braxton Hicks contractions can happen more often and may become stronger. These contractions are sometimes difficult to tell apart from true labor because they can be very uncomfortable. You should not feel embarrassed if you go to the hospital with false labor. Sometimes, the only way to tell if you are in true labor is for your health care provider to look for changes in the cervix. The health care provider will do a physical exam and may monitor your contractions. If you are not in true labor, the exam should show that your cervix is not dilating and your water has not broken. If there are no other health problems associated with your pregnancy, it is completely safe for you to be sent home with false labor. You may continue to have Braxton Hicks contractions until you go into true labor. How to tell the difference between true labor and false labor True labor  Contractions last 30-70 seconds.  Contractions become very regular.  Discomfort is usually felt in the top of the uterus, and it spreads to the lower abdomen and low back.  Contractions do not go away with walking.  Contractions usually become more intense and increase in frequency.  The cervix dilates and gets thinner. False labor  Contractions are usually shorter and not as strong as true labor contractions.  Contractions are usually irregular.  Contractions  are often felt in the front of the lower abdomen and in the groin.  Contractions may go away when you walk around or change positions while lying down.  Contractions get weaker and are shorter-lasting as time goes on.  The cervix usually does not dilate or become thin. Follow these instructions at home:   Take over-the-counter and prescription medicines only as told by your health care provider.  Keep up with your usual exercises and follow other instructions from your health care provider.  Eat and drink lightly if you think you are going into labor.  If Braxton Hicks contractions are making you uncomfortable: ? Change your position from lying down or resting to walking, or change from walking to resting. ? Sit and rest in a tub of warm water. ? Drink enough fluid to keep your urine pale yellow. Dehydration may cause these contractions. ? Do slow and deep breathing several times an hour.  Keep all follow-up prenatal visits as told by your health care provider. This is important. Contact a health care provider if:  You have a fever.  You have continuous pain in your abdomen. Get help right away if:  Your contractions become stronger, more regular, and closer together.  You have fluid leaking or gushing from your vagina.  You pass blood-tinged mucus (bloody show).  You have bleeding from your vagina.  You have low back pain that you never had before.  You feel your baby's head pushing down and causing pelvic pressure.  Your baby is not moving inside you as much as it used to. Summary  Contractions that occur before labor are   called Braxton Hicks contractions, false labor, or practice contractions. °· Braxton Hicks contractions are usually shorter, weaker, farther apart, and less regular than true labor contractions. True labor contractions usually become progressively stronger and regular, and they become more frequent. °· Manage discomfort from Braxton Hicks contractions  by changing position, resting in a warm bath, drinking plenty of water, or practicing deep breathing. °This information is not intended to replace advice given to you by your health care provider. Make sure you discuss any questions you have with your health care provider. °Document Revised: 10/20/2017 Document Reviewed: 03/23/2017 °Elsevier Patient Education © 2020 [employer] ° ° °Fetal Movement Counts ° °What is a fetal movement count? ° °A fetal movement count is the number of times that you feel your baby move during a certain amount of time. This may also be called a fetal kick count. A fetal movement count is recommended for every pregnant woman. You may be asked to start counting fetal movements as early as week 28 of your pregnancy. °Pay attention to when your baby is most active. You may notice your baby's sleep and wake cycles. You may also notice things that make your baby move more. You should do a fetal movement count: °· When your baby is normally most active. °· At the same time each day. °A good time to count movements is while you are resting, after having something to eat and drink. °How do I count fetal movements? °1. Find a quiet, comfortable area. Sit, or lie down on your side. °2. Write down the date, the start time and stop time, and the number of movements that you felt between those two times. Take this information with you to your health care visits. °3. Write down your start time when you feel the first movement. °4. Count kicks, flutters, swishes, rolls, and jabs. You should feel at least 10 movements. °5. You may stop counting after you have felt 10 movements, or if you have been counting for 2 hours. Write down the stop time. °6. If you do not feel 10 movements in 2 hours, contact your health care provider for further instructions. Your health care provider may want to do additional tests to assess your baby's well-being. °Contact a health care provider if: °· You feel fewer than 10  movements in 2 hours. °· Your baby is not moving like he or she usually does. °Date: ____________ Start time: ____________ Stop time: ____________ Movements: ____________ °Date: ____________ Start time: ____________ Stop time: ____________ Movements: ____________ °Date: ____________ Start time: ____________ Stop time: ____________ Movements: ____________ °Date: ____________ Start time: ____________ Stop time: ____________ Movements: ____________ °Date: ____________ Start time: ____________ Stop time: ____________ Movements: ____________ °Date: ____________ Start time: ____________ Stop time: ____________ Movements: ____________ °Date: ____________ Start time: ____________ Stop time: ____________ Movements: ____________ °Date: ____________ Start time: ____________ Stop time: ____________ Movements: ____________ °Date: ____________ Start time: ____________ Stop time: ____________ Movements: ____________ °This information is not intended to replace advice given to you by your health care provider. Make sure you discuss any questions you have with your health care provider. °Document Revised: 06/27/2019 Document Reviewed: 06/27/2019 °Elsevier Patient Education © 2020 [employer] Inc[employer] ° °

## 2020-09-25 NOTE — MAU Note (Signed)
Had pelvic pain and  pressure that happened just before coming in that lasted a long time. I had just got out of the shower when the pain occurred. No pain now. Denies VB or LOF.

## 2020-09-25 NOTE — MAU Provider Note (Signed)
S: Ms. Rebecca Bean is a 20 y.o. G1P0000 at [redacted]w[redacted]d  who presents to MAU today for labor evaluation.     Cervical exam by RN:  Dilation: Fingertip Exam by:: D. Kiger RN  Fetal Monitoring: Baseline: 135 Variability: moderate Accelerations: multiple present, reactive strip Decelerations: rare mild variable decel Contractions: irregular  MDM Discussed patient with RN. NST reviewed & reactive.  A: SIUP at [redacted]w[redacted]d  False labor  P: Discharge home Labor precautions and kick counts included in AVS Patient to follow-up with prenatal provider on 09/30/20 as scheduled  Patient may return to MAU as needed or when in labor   Sheila Oats, MD 09/25/2020 7:57 AM

## 2020-09-29 ENCOUNTER — Encounter: Payer: Medicaid Other | Admitting: Nurse Practitioner

## 2020-09-30 ENCOUNTER — Other Ambulatory Visit: Payer: Self-pay

## 2020-09-30 ENCOUNTER — Ambulatory Visit (INDEPENDENT_AMBULATORY_CARE_PROVIDER_SITE_OTHER): Payer: Medicaid Other | Admitting: Nurse Practitioner

## 2020-09-30 VITALS — BP 116/71 | HR 79 | Wt 196.0 lb

## 2020-09-30 DIAGNOSIS — O99820 Streptococcus B carrier state complicating pregnancy: Secondary | ICD-10-CM

## 2020-09-30 DIAGNOSIS — Z3A39 39 weeks gestation of pregnancy: Secondary | ICD-10-CM

## 2020-09-30 DIAGNOSIS — Z3400 Encounter for supervision of normal first pregnancy, unspecified trimester: Secondary | ICD-10-CM

## 2020-09-30 NOTE — Progress Notes (Signed)
    Subjective:  Rebecca Bean is a 19 y.o. G1P0000 at [redacted]w[redacted]d being seen today for ongoing prenatal care.  She is currently monitored for the following issues for this low-risk pregnancy and has Supervision of normal first pregnancy; Gonorrhea in pregnancy, antepartum, first trimester; Chlamydia trachomatis infection in pregnancy in first trimester; Group B Streptococcus carrier, +RV culture, currently pregnant; Alpha thalassemia silent carrier; and Maternal iron deficiency anemia complicating pregnancy in third trimester on their problem list.  Patient reports occasional contractions.  Contractions: Irritability. Vag. Bleeding: None.  Movement: Present. Denies leaking of fluid.   The following portions of the patient's history were reviewed and updated as appropriate: allergies, current medications, past family history, past medical history, past social history, past surgical history and problem list. Problem list updated.  Objective:   Vitals:   09/30/20 1600  BP: 116/71  Pulse: 79  Weight: 196 lb (88.9 kg)    Fetal Status: Fetal Heart Rate (bpm): 161 Fundal Height: 40 cm Movement: Present  Presentation: Vertex  General:  Alert, oriented and cooperative. Patient is in no acute distress.  Skin: Skin is warm and dry. No rash noted.   Cardiovascular: Normal heart rate noted  Respiratory: Normal respiratory effort, no problems with respiration noted  Abdomen: Soft, gravid, appropriate for gestational age. Pain/Pressure: Present     Pelvic:  Cervical exam performed Dilation: Closed Effacement (%): 50 Station: -3  Extremities: Normal range of motion.  Edema: None  Mental Status: Normal mood and affect. Normal behavior. Normal judgment and thought content.   Urinalysis:      Assessment and Plan:  Pregnancy: G1P0000 at [redacted]w[redacted]d  1. Supervision of normal first pregnancy, antepartum Wanted membranes swept but internal cervical os was closed and very soft Discussed induction at 41 weeks if  undelivered - will schedule induction and orders entered NST at next visit at 40 weeks  2. Group B Streptococcus carrier, +RV culture, currently pregnant Will treat in labor   Term labor symptoms and general obstetric precautions including but not limited to vaginal bleeding, contractions, leaking of fluid and fetal movement were reviewed in detail with the patient. Please refer to After Visit Summary for other counseling recommendations.  Return in about 1 week (around 10/07/2020) for in person ROB and NST.  Nolene Bernheim, RN, MSN, NP-BC Nurse Practitioner, [facility] for [employer], [facility] 09/30/2020 4:30 PM

## 2020-09-30 NOTE — Progress Notes (Signed)
ROB   CC: None   Pt would like cervix check today. Pt states last visit she could get her membranes swept  GBS + in urine per notes.   Recent MAU visit on 09/25/20.

## 2020-10-01 ENCOUNTER — Telehealth (HOSPITAL_COMMUNITY): Payer: Self-pay | Admitting: *Deleted

## 2020-10-01 ENCOUNTER — Other Ambulatory Visit (HOSPITAL_COMMUNITY): Payer: Self-pay | Admitting: Advanced Practice Midwife

## 2020-10-01 NOTE — Telephone Encounter (Signed)
Preadmission screen  

## 2020-10-05 ENCOUNTER — Telehealth (HOSPITAL_COMMUNITY): Payer: Self-pay | Admitting: *Deleted

## 2020-10-05 NOTE — Telephone Encounter (Signed)
Preadmission screen  

## 2020-10-06 ENCOUNTER — Ambulatory Visit (INDEPENDENT_AMBULATORY_CARE_PROVIDER_SITE_OTHER): Payer: Medicaid Other | Admitting: Advanced Practice Midwife

## 2020-10-06 ENCOUNTER — Other Ambulatory Visit: Payer: Self-pay

## 2020-10-06 ENCOUNTER — Encounter (HOSPITAL_COMMUNITY): Payer: Self-pay | Admitting: *Deleted

## 2020-10-06 ENCOUNTER — Telehealth (HOSPITAL_COMMUNITY): Payer: Self-pay | Admitting: *Deleted

## 2020-10-06 VITALS — BP 118/66 | HR 87 | Wt 199.0 lb

## 2020-10-06 DIAGNOSIS — Z3A39 39 weeks gestation of pregnancy: Secondary | ICD-10-CM

## 2020-10-06 DIAGNOSIS — Z3400 Encounter for supervision of normal first pregnancy, unspecified trimester: Secondary | ICD-10-CM

## 2020-10-06 DIAGNOSIS — O99013 Anemia complicating pregnancy, third trimester: Secondary | ICD-10-CM

## 2020-10-06 DIAGNOSIS — D509 Iron deficiency anemia, unspecified: Secondary | ICD-10-CM

## 2020-10-06 DIAGNOSIS — O99820 Streptococcus B carrier state complicating pregnancy: Secondary | ICD-10-CM

## 2020-10-06 NOTE — Telephone Encounter (Signed)
Preadmission screen  

## 2020-10-06 NOTE — Patient Instructions (Addendum)
Things to Try After 37 weeks to Encourage Labor/Get Ready for Labor:   1.  Try the Miles Circuit at www.milescircuit.com daily to improve baby's position and encourage the onset of labor.  2. Walk a little and rest a little every day.  Change positions often.  3. Cervical Ripening: May try one or both a. Red Raspberry Leaf capsules or tea:  two 300mg or 400mg tablets with each meal, 2-3 times a day, or 1-3 cups of tea daily  Potential Side Effects Of Raspberry Leaf:  Most women do not experience any side effects from drinking raspberry leaf tea. However, nausea and loose stools are possible   b. Evening Primrose Oil capsules: may take 1 to 3 capsules daily. Take 1-2 capsules by mouth each day and place one capsule vaginally at night.  You may also prick the vaginal capsule to release the oil prior to inserting in the vagina. Some of the potential side effects:  Upset stomach  Loose stools or diarrhea  Headaches  Nausea  4. Sex (and especially sex with orgasm) can also help the cervix ripen and encourage labor onset.  Labor Precautions Reasons to come to MAU at [facility]:  1.  Contractions are  5 minutes apart or less, each last 1 minute, these have been going on for 1-2 hours, and you cannot walk or talk during them 2.  You have a large gush of fluid, or a trickle of fluid that will not stop and you have to wear a pad 3.  You have bleeding that is bright red, heavier than spotting--like menstrual bleeding (spotting can be normal in early labor or after a check of your cervix) 4.  You do not feel the baby moving like he/she normally does 

## 2020-10-06 NOTE — Progress Notes (Signed)
   PRENATAL VISIT NOTE  Subjective:  Rebecca Bean is a 19 y.o. G1P0000 at [redacted]w[redacted]d being seen today for ongoing prenatal care.  She is currently monitored for the following issues for this low-risk pregnancy and has Supervision of normal first pregnancy; Gonorrhea in pregnancy, antepartum, first trimester; Chlamydia trachomatis infection in pregnancy in first trimester; Group B Streptococcus carrier, +RV culture, currently pregnant; Alpha thalassemia silent carrier; and Maternal iron deficiency anemia complicating pregnancy in third trimester on their problem list.  Patient reports occasional contractions.  Contractions: Irregular. Vag. Bleeding: None.  Movement: Present. Denies leaking of fluid.   The following portions of the patient's history were reviewed and updated as appropriate: allergies, current medications, past family history, past medical history, past social history, past surgical history and problem list.   Objective:   Vitals:   10/06/20 1557  BP: 118/66  Pulse: 87  Weight: 199 lb (90.3 kg)    Fetal Status: Fetal Heart Rate (bpm): 150 Fundal Height: 38 cm Movement: Present  Presentation: Vertex  General:  Alert, oriented and cooperative. Patient is in no acute distress.  Skin: Skin is warm and dry. No rash noted.   Cardiovascular: Normal heart rate noted  Respiratory: Normal respiratory effort, no problems with respiration noted  Abdomen: Soft, gravid, appropriate for gestational age.  Pain/Pressure: Present     Pelvic: Cervical exam performed in the presence of a chaperone Dilation: 1 Effacement (%): 50 Station: -3  Extremities: Normal range of motion.     Mental Status: Normal mood and affect. Normal behavior. Normal judgment and thought content.   Assessment and Plan:  Pregnancy: G1P0000 at [redacted]w[redacted]d 1. Supervision of normal first pregnancy, antepartum --Anticipatory guidance about next visits/weeks of pregnancy given. --Good fetal movement --Membranes swept at pt  request, cervix 1/50/-3, vertex --Pt to do [employer] at home today, walk a little every day, rest every day, info on cervical ripening/labor readiness printed for pt --NST on Friday for postdates, next prenatal visit in 1 week in office  2. Group B Streptococcus carrier, +RV culture, currently pregnant   3. [redacted] weeks gestation of pregnancy   Term labor symptoms and general obstetric precautions including but not limited to vaginal bleeding, contractions, leaking of fluid and fetal movement were reviewed in detail with the patient. Please refer to After Visit Summary for other counseling recommendations.   No follow-ups on file.  Future Appointments  Date Time Provider Department Center  10/09/2020 10:00 AM [facility] NURSE [facility] None  10/12/2020  9:45 AM [facility] None  10/13/2020  2:45 PM Adam Phenix, MD [facility] None  10/14/2020  6:30 AM [facility] SCHED ROOM [facility] None    Sharen Counter, CNM

## 2020-10-07 ENCOUNTER — Other Ambulatory Visit: Payer: Self-pay | Admitting: Advanced Practice Midwife

## 2020-10-09 ENCOUNTER — Other Ambulatory Visit: Payer: Self-pay

## 2020-10-09 ENCOUNTER — Ambulatory Visit: Payer: Medicaid Other

## 2020-10-09 DIAGNOSIS — D509 Iron deficiency anemia, unspecified: Secondary | ICD-10-CM

## 2020-10-09 DIAGNOSIS — O99013 Anemia complicating pregnancy, third trimester: Secondary | ICD-10-CM

## 2020-10-09 DIAGNOSIS — Z3403 Encounter for supervision of normal first pregnancy, third trimester: Secondary | ICD-10-CM

## 2020-10-09 NOTE — Progress Notes (Signed)
ROB presents for NST only per Sharen Counter, CNM for post dates.   CC: none.   NST reviewed by ava provider Reactive per Dr.Harper Pt made aware of labor precautions And advised to keep scheduled appt.  Pt agreeable and voiced understanding.

## 2020-10-12 ENCOUNTER — Other Ambulatory Visit (HOSPITAL_COMMUNITY)
Admission: RE | Admit: 2020-10-12 | Discharge: 2020-10-12 | Disposition: A | Discharge status: Home / Self Care | Payer: Medicaid Other | Source: Ambulatory Visit | Attending: Family Medicine | Admitting: Family Medicine

## 2020-10-12 DIAGNOSIS — Z01812 Encounter for preprocedural laboratory examination: Secondary | ICD-10-CM | POA: Insufficient documentation

## 2020-10-12 DIAGNOSIS — Z20822 Contact with and (suspected) exposure to covid-19: Secondary | ICD-10-CM | POA: Insufficient documentation

## 2020-10-12 LAB — SARS CORONAVIRUS 2 (TAT 6-24 HRS): SARS Coronavirus 2: NEGATIVE

## 2020-10-13 ENCOUNTER — Ambulatory Visit (INDEPENDENT_AMBULATORY_CARE_PROVIDER_SITE_OTHER): Payer: Medicaid Other | Admitting: Obstetrics & Gynecology

## 2020-10-13 ENCOUNTER — Other Ambulatory Visit: Payer: Self-pay

## 2020-10-13 ENCOUNTER — Ambulatory Visit (INDEPENDENT_AMBULATORY_CARE_PROVIDER_SITE_OTHER): Payer: Medicaid Other

## 2020-10-13 VITALS — BP 113/71 | HR 80 | Wt 203.0 lb

## 2020-10-13 DIAGNOSIS — Z3A40 40 weeks gestation of pregnancy: Secondary | ICD-10-CM | POA: Diagnosis not present

## 2020-10-13 DIAGNOSIS — O480 Post-term pregnancy: Secondary | ICD-10-CM

## 2020-10-13 DIAGNOSIS — Z3403 Encounter for supervision of normal first pregnancy, third trimester: Secondary | ICD-10-CM

## 2020-10-13 NOTE — Progress Notes (Signed)
   PRENATAL VISIT NOTE  Subjective:  Rebecca Bean is a 19 y.o. G1P0000 at [redacted]w[redacted]d being seen today for ongoing prenatal care.  She is currently monitored for the following issues for this high-risk pregnancy and has Supervision of normal first pregnancy; Gonorrhea in pregnancy, antepartum, first trimester; Chlamydia trachomatis infection in pregnancy in first trimester; Group B Streptococcus carrier, +RV culture, currently pregnant; Alpha thalassemia silent carrier; and Maternal iron deficiency anemia complicating pregnancy in third trimester on their problem list.  Patient reports no complaints.  Contractions: Irregular. Vag. Bleeding: None.  Movement: Present. Denies leaking of fluid.   The following portions of the patient's history were reviewed and updated as appropriate: allergies, current medications, past family history, past medical history, past social history, past surgical history and problem list.   Objective:   Vitals:   10/13/20 1441  BP: 113/71  Pulse: 80  Weight: 203 lb (92.1 kg)    Fetal Status:     Movement: Present     General:  Alert, oriented and cooperative. Patient is in no acute distress.  Skin: Skin is warm and dry. No rash noted.   Cardiovascular: Normal heart rate noted  Respiratory: Normal respiratory effort, no problems with respiration noted  Abdomen: Soft, gravid, appropriate for gestational age.  Pain/Pressure: Present     Pelvic: Cervical exam deferred        Extremities: Normal range of motion.  Edema: None  Mental Status: Normal mood and affect. Normal behavior. Normal judgment and thought content.   Assessment and Plan:  Pregnancy: G1P0000 at [redacted]w[redacted]d 1. [redacted] weeks gestation of pregnancy IOL tomorrow. NST reactive and AFI today - US OB Limited; Future  2. Encounter for supervision of normal first pregnancy in third trimester   3. Post-term pregnancy, 40-42 weeks of gestation  - US OB Limited;  AFI 19 cm  Term labor symptoms and general  obstetric precautions including but not limited to vaginal bleeding, contractions, leaking of fluid and fetal movement were reviewed in detail with the patient. Please refer to After Visit Summary for other counseling recommendations.   Return if symptoms worsen or fail to improve, for postpartum.  Future Appointments  Date Time Provider Department Center  10/13/2020  3:45 PM [facility] ULTRASOUND [facility] None  10/14/2020  6:30 AM [facility] SCHED ROOM [facility] None    Scheryl Darter, MD

## 2020-10-13 NOTE — Patient Instructions (Signed)

## 2020-10-14 ENCOUNTER — Inpatient Hospital Stay (HOSPITAL_COMMUNITY)
Admission: AD | Admit: 2020-10-14 | Discharge: 2020-10-18 | DRG: 787 | Disposition: A | Discharge status: Home / Self Care | Payer: Medicaid Other | Attending: Family Medicine | Admitting: Family Medicine

## 2020-10-14 ENCOUNTER — Other Ambulatory Visit: Payer: Self-pay

## 2020-10-14 ENCOUNTER — Inpatient Hospital Stay (HOSPITAL_COMMUNITY): Payer: Medicaid Other

## 2020-10-14 DIAGNOSIS — O99824 Streptococcus B carrier state complicating childbirth: Secondary | ICD-10-CM | POA: Diagnosis present

## 2020-10-14 DIAGNOSIS — Z975 Presence of (intrauterine) contraceptive device: Secondary | ICD-10-CM

## 2020-10-14 DIAGNOSIS — Z3A41 41 weeks gestation of pregnancy: Secondary | ICD-10-CM | POA: Diagnosis not present

## 2020-10-14 DIAGNOSIS — O99820 Streptococcus B carrier state complicating pregnancy: Secondary | ICD-10-CM

## 2020-10-14 DIAGNOSIS — A568 Sexually transmitted chlamydial infection of other sites: Secondary | ICD-10-CM | POA: Diagnosis present

## 2020-10-14 DIAGNOSIS — O98211 Gonorrhea complicating pregnancy, first trimester: Secondary | ICD-10-CM | POA: Diagnosis present

## 2020-10-14 DIAGNOSIS — Z87891 Personal history of nicotine dependence: Secondary | ICD-10-CM

## 2020-10-14 DIAGNOSIS — O9081 Anemia of the puerperium: Secondary | ICD-10-CM | POA: Diagnosis not present

## 2020-10-14 DIAGNOSIS — Z98891 History of uterine scar from previous surgery: Secondary | ICD-10-CM

## 2020-10-14 DIAGNOSIS — D563 Thalassemia minor: Secondary | ICD-10-CM | POA: Diagnosis present

## 2020-10-14 DIAGNOSIS — D509 Iron deficiency anemia, unspecified: Secondary | ICD-10-CM | POA: Diagnosis present

## 2020-10-14 DIAGNOSIS — Z30430 Encounter for insertion of intrauterine contraceptive device: Secondary | ICD-10-CM | POA: Diagnosis not present

## 2020-10-14 DIAGNOSIS — D62 Acute posthemorrhagic anemia: Secondary | ICD-10-CM | POA: Diagnosis not present

## 2020-10-14 DIAGNOSIS — O480 Post-term pregnancy: Secondary | ICD-10-CM | POA: Diagnosis present

## 2020-10-14 DIAGNOSIS — O99013 Anemia complicating pregnancy, third trimester: Secondary | ICD-10-CM | POA: Diagnosis present

## 2020-10-14 DIAGNOSIS — O98311 Other infections with a predominantly sexual mode of transmission complicating pregnancy, first trimester: Secondary | ICD-10-CM | POA: Diagnosis present

## 2020-10-14 MED ORDER — OXYTOCIN BOLUS FROM INFUSION: 333.0000 mL | Freq: Once | INTRAVENOUS | Status: DC

## 2020-10-14 MED ORDER — OXYTOCIN-SODIUM CHLORIDE 30-0.9 UT/500ML-% IV SOLN: 1.0000 m[IU]/min | INTRAVENOUS | Status: DC

## 2020-10-14 MED ORDER — OXYCODONE-ACETAMINOPHEN 5-325 MG PO TABS: 1.0000 | ORAL_TABLET | ORAL | Status: DC | PRN

## 2020-10-14 MED ORDER — PENICILLIN G POT IN DEXTROSE 60000 UNIT/ML IV SOLN
3.0000 10*6.[IU] | INTRAVENOUS | Status: DC
  Administered 2020-10-15 (×2): 3 10*6.[IU] via INTRAVENOUS
  Filled 2020-10-14 (×2): qty 50

## 2020-10-14 MED ORDER — MISOPROSTOL 25 MCG QUARTER TABLET
25.0000 ug | ORAL_TABLET | ORAL | Status: DC | PRN
  Administered 2020-10-15 (×2): 25 ug via VAGINAL
  Filled 2020-10-14 (×3): qty 1

## 2020-10-14 MED ORDER — ONDANSETRON HCL 4 MG/2ML IJ SOLN
4.0000 mg | Freq: Four times a day (QID) | INTRAMUSCULAR | Status: DC | PRN
  Administered 2020-10-15: 4 mg via INTRAVENOUS
  Filled 2020-10-14: qty 2

## 2020-10-14 MED ORDER — LACTATED RINGERS IV SOLN
500.0000 mL | INTRAVENOUS | Status: DC | PRN
  Administered 2020-10-15 (×2): 1000 mL via INTRAVENOUS
  Administered 2020-10-15: 250 mL via INTRAVENOUS

## 2020-10-14 MED ORDER — SODIUM CHLORIDE 0.9 % IV SOLN
5.0000 10*6.[IU] | Freq: Once | INTRAVENOUS | Status: AC
  Administered 2020-10-15: 5 10*6.[IU] via INTRAVENOUS
  Filled 2020-10-14: qty 5

## 2020-10-14 MED ORDER — SOD CITRATE-CITRIC ACID 500-334 MG/5ML PO SOLN
30.0000 mL | ORAL | Status: DC | PRN
  Filled 2020-10-14: qty 15

## 2020-10-14 MED ORDER — FLEET ENEMA 7-19 GM/118ML RE ENEM: 1.0000 | ENEMA | RECTAL | Status: DC | PRN

## 2020-10-14 MED ORDER — OXYTOCIN-SODIUM CHLORIDE 30-0.9 UT/500ML-% IV SOLN: 2.5000 [IU]/h | INTRAVENOUS | Status: DC

## 2020-10-14 MED ORDER — ACETAMINOPHEN 325 MG PO TABS: 650.0000 mg | ORAL_TABLET | ORAL | Status: DC | PRN

## 2020-10-14 MED ORDER — LACTATED RINGERS IV SOLN: INTRAVENOUS | Status: DC

## 2020-10-14 MED ORDER — LIDOCAINE HCL (PF) 1 % IJ SOLN: 30.0000 mL | INTRAMUSCULAR | Status: DC | PRN

## 2020-10-14 MED ORDER — OXYCODONE-ACETAMINOPHEN 5-325 MG PO TABS: 2.0000 | ORAL_TABLET | ORAL | Status: DC | PRN

## 2020-10-14 MED ORDER — TERBUTALINE SULFATE 1 MG/ML IJ SOLN
0.2500 mg | Freq: Once | INTRAMUSCULAR | Status: AC | PRN
  Administered 2020-10-15: 0.25 mg via SUBCUTANEOUS
  Filled 2020-10-14: qty 1

## 2020-10-14 NOTE — H&P (Addendum)
OBSTETRIC ADMISSION HISTORY AND PHYSICAL  Rebecca Bean is a 19 y.o. female G1P0000 with IUP at 8w0dby ultrasound presenting for post dates IOL. She reports +FMs, No LOF, no VB, no blurry vision, headaches or peripheral edema, and RUQ pain.  She plans on bottle feeding. She request post partum IUD for birth control. She received her prenatal care at  [facility]  Estimated Date of Delivery: 10/07/20  Sono:    @[redacted]w[redacted]d , CWD, normal anatomy, breech presentation, 140g, 29% EFW   Prenatal History/Complications:  -High risk pregnancy [id number] -hx of gonorrhea and chlamydia during pregnancy  -GBS positive  -Alpha thalassemia silent carrier  -Maternal iron deficiency anemia   Past Medical History: Past Medical History:  Diagnosis Date  . Heart murmur     Past Surgical History: Past Surgical History:  Procedure Laterality Date  . NO PAST SURGERIES      Obstetrical History: OB History     Gravida  1   Para  0   Term  0   Preterm  0   AB  0   Living         SAB  0   TAB  0   Ectopic  0   Multiple      Live Births              Social History Social History   Socioeconomic History  . Marital status: Significant Other    Spouse name: Not on file  . Number of children: Not on file  . Years of education: Not on file  . Highest education level: Not on file  Occupational History  . Not on file  Tobacco Use  . Smoking status: Former SmResearch scientist (life sciences). Smokeless tobacco: Never Used  Vaping Use  . Vaping Use: Never used  Substance and Sexual Activity  . Alcohol use: No  . Drug use: No  . Sexual activity: Yes  Other Topics Concern  . Not on file  Social History Narrative   ** Merged History Encounter **       Social Determinants of Health   Financial Resource Strain:   . Difficulty of Paying Living Expenses: Not on file  Food Insecurity:   . Worried About RuCharity fundraisern the Last Year: Not on file  . Ran Out of Food in  the Last Year: Not on file  Transportation Needs:   . Lack of Transportation (Medical): Not on file  . Lack of Transportation (Non-Medical): Not on file  Physical Activity:   . Days of Exercise per Week: Not on file  . Minutes of Exercise per Session: Not on file  Stress:   . Feeling of Stress : Not on file  Social Connections:   . Frequency of Communication with Friends and Family: Not on file  . Frequency of Social Gatherings with Friends and Family: Not on file  . Attends Religious Services: Not on file  . Active Member of Clubs or Organizations: Not on file  . Attends ClArchivisteetings: Not on file  . Marital Status: Not on file    Family History: Family History  Problem Relation Age of Onset  . Hypertension Maternal Grandmother     Allergies: No Known Allergies  Medications Prior to Admission  Medication Sig Dispense Refill Last Dose  . Blood Pressure Monitoring (BLOOD PRESSURE KIT) DEVI 1 kit by Does not apply route once a week. Check Blood Pressure regularly  and record readings into the Bab[employer] App.  Large Cuff.  DX O90.0 1 each 0   . docusate sodium (COLACE) 100 MG capsule Take 1 capsule (100 mg total) by mouth 2 (two) times daily as needed for mild constipation or moderate constipation. (Patient not taking: Reported on 09/30/2020) 30 capsule 2   . ferrous sulfate (FERROUSUL) 325 (65 FE) MG tablet Take 1 tablet (325 mg total) by mouth every other day. (Patient not taking: Reported on 10/06/2020) 60 tablet 3   . Prenatal Vit-Fe Phos-FA-Omega (VITAFOL GUMMIES) 3.33-0.333-34.8 MG CHEW Chew 3 tablets by mouth daily. (Patient not taking: Reported on 10/06/2020) 90 tablet 3      Review of Systems   All systems reviewed and negative except as stated in HPI  Height 5' 6"  (1.676 m), weight 93.8 kg, last menstrual period 12/12/2019. General appearance: alert, cooperative and no distress Chest: normal respiratory effort  Abdomen: soft, non-tender;  gravid Extremities: no sign of DVT, no LE edema noted bilaterally Presentation: cephalic  Per OB US on 04/28/5915  Fetal monitoringBaseline: 140 bpm, Variability: Good {> 6 bpm), Accelerations: Reactive and Decelerations: Absent Uterine activity: intermittent contractions     Prenatal labs: ABO, Rh: O/Positive/-- (04/20 1618) Antibody: Negative (04/20 1618) Rubella: 11.10 (04/20 1618) RPR: Non Reactive (09/07 1105)  HBsAg: Negative (04/20 1618)  HIV: Non Reactive (09/07 1105)  GBS:   positive (08/11/2020) 1 hr Glucola 110 (07/28/2020) Genetic screening  low risk for AFP (04/23/2020) Anatomy US normal (04/23/2020)  Prenatal Transfer Tool  Maternal Diabetes: No Genetic Screening: Normal Maternal Ultrasounds/Referrals: Isolated EIF (echogenic intracardiac focus) Fetal Ultrasounds or other Referrals:  None Maternal Substance Abuse:  No Significant Maternal Medications:  None Significant Maternal Lab Results: Group B Strep positive  No results found for this or any previous visit (from the past 24 hour(s)).  Patient Active Problem List   Diagnosis Date Noted  . Post term pregnancy at [redacted] weeks gestation 10/14/2020  . Maternal iron deficiency anemia complicating pregnancy in third trimester 07/29/2020  . Alpha thalassemia silent carrier 03/23/2020  . Group B Streptococcus carrier, +RV culture, currently pregnant 03/15/2020  . Gonorrhea in pregnancy, antepartum, first trimester 03/11/2020  . Chlamydia trachomatis infection in pregnancy in first trimester 03/11/2020  . Supervision of normal first pregnancy 02/10/2020    Assessment/Plan:  Rebecca Bean is a 19 y.o. G1P0000 at 4w0dhere for post dates IOL with high risk pregnancy.  #Labor: Post term pregnancy at 41 weeks, post dates IOL. Pitocin ordered, titrate appropriately. Continue to monitor. #Pain: epidural desired  #FWB: Category 1 #ID: GBS positive, penicillin G ordered  #MOF: bottle #MOC: IUD post partum  #Circ:  Yes #Maternal iron deficiency anemia: most recent Hgb 9.6 on 07/27/2020. Repeat CBC after delivery, consider iron supplementation. #High risk pregnancy with previous gonorrhea and chlamydia: GC/Chlamydia negative on 06/19/2020 and non-reactive RPR on 07/28/2020  ADonney Dice DO  10/14/2020, 11:22 PM  CNM attestation:  I have seen and examined this patient and agree with above documentation in the resident''s note.   TVantasia Pinkneyis a 19y.o. G1P0000 at 466w1deporting for IOL for postdates.  +FM, denies LOF, VB, contractions, vaginal discharge.  PE: Patient Vitals for the past 24 hrs:  BP Temp Temp src Pulse Resp Height Weight  10/15/20 0230 108/70 -- -- 84 -- -- --  10/15/20 0200 (!) 107/94 -- -- 82 -- -- --  10/15/20 0135 123/78 -- -- 84 -- -- --  10/15/20 0036 120/85 -- -- 99 -- -- --  10/14/20 2354 -- 98.2 F (36.8 C) Oral -- 18 5' 6"  (1.676 m) 206 lb 11.2 oz (93.8 kg)  10/14/20 2304 -- -- -- -- -- 5' 6"  (1.676 m) 206 lb 11.2 oz (93.8 kg)   Gen: calm comfortable, NAD Resp: normal effort, no distress Heart: Regular rate Abd: Soft, NT, gravid, S=D  FHR at 0322 Baseline 145, Variability, pos accels, no decels Toco: irregular contractions including periods of no contractions  ROS, labs, PMH reviewed  Assessment: Early labor  Plan: -start cytotec; place FB when cervix is soft enough   Starr Lake, CNM 10/15/2020 3:17 AM

## 2020-10-15 ENCOUNTER — Inpatient Hospital Stay (HOSPITAL_COMMUNITY): Payer: Medicaid Other | Admitting: Anesthesiology

## 2020-10-15 ENCOUNTER — Encounter (HOSPITAL_COMMUNITY): Payer: Self-pay | Admitting: Family Medicine

## 2020-10-15 ENCOUNTER — Encounter (HOSPITAL_COMMUNITY)
Admission: AD | Disposition: A | Discharge status: Home / Self Care | Payer: Self-pay | Source: Home / Self Care | Attending: Family Medicine

## 2020-10-15 DIAGNOSIS — Z975 Presence of (intrauterine) contraceptive device: Secondary | ICD-10-CM

## 2020-10-15 DIAGNOSIS — Z3A41 41 weeks gestation of pregnancy: Secondary | ICD-10-CM

## 2020-10-15 DIAGNOSIS — Z30430 Encounter for insertion of intrauterine contraceptive device: Secondary | ICD-10-CM

## 2020-10-15 LAB — TYPE AND SCREEN
ABO/RH(D): O POS
Antibody Screen: NEGATIVE

## 2020-10-15 LAB — CBC
HCT: 30.6 % — ABNORMAL LOW (ref 36.0–46.0)
Hemoglobin: 9.3 g/dL — ABNORMAL LOW (ref 12.0–15.0)
MCH: 24 pg — ABNORMAL LOW (ref 26.0–34.0)
MCHC: 30.4 g/dL (ref 30.0–36.0)
MCV: 79.1 fL — ABNORMAL LOW (ref 80.0–100.0)
Platelets: 272 10*3/uL (ref 150–400)
RBC: 3.87 MIL/uL (ref 3.87–5.11)
RDW: 15.1 % (ref 11.5–15.5)
WBC: 10.8 10*3/uL — ABNORMAL HIGH (ref 4.0–10.5)
nRBC: 0 % (ref 0.0–0.2)

## 2020-10-15 LAB — RPR: RPR Ser Ql: NONREACTIVE

## 2020-10-15 SURGERY — Surgical Case
Anesthesia: Spinal

## 2020-10-15 MED ORDER — SENNOSIDES-DOCUSATE SODIUM 8.6-50 MG PO TABS
2.0000 | ORAL_TABLET | ORAL | Status: DC
  Administered 2020-10-15 – 2020-10-18 (×3): 2 via ORAL
  Filled 2020-10-15 (×3): qty 2

## 2020-10-15 MED ORDER — DIPHENHYDRAMINE HCL 25 MG PO CAPS: 25.0000 mg | ORAL_CAPSULE | Freq: Four times a day (QID) | ORAL | Status: DC | PRN

## 2020-10-15 MED ORDER — COCONUT OIL OIL: 1.0000 "application " | TOPICAL_OIL | Status: DC | PRN

## 2020-10-15 MED ORDER — ONDANSETRON HCL 4 MG/2ML IJ SOLN: 4.0000 mg | Freq: Three times a day (TID) | INTRAMUSCULAR | Status: DC | PRN

## 2020-10-15 MED ORDER — NALBUPHINE HCL 10 MG/ML IJ SOLN: 5.0000 mg | INTRAMUSCULAR | Status: DC | PRN

## 2020-10-15 MED ORDER — IBUPROFEN 800 MG PO TABS
800.0000 mg | ORAL_TABLET | Freq: Four times a day (QID) | ORAL | Status: DC
  Administered 2020-10-16 – 2020-10-18 (×8): 800 mg via ORAL
  Filled 2020-10-15 (×8): qty 1

## 2020-10-15 MED ORDER — PRENATAL MULTIVITAMIN CH
1.0000 | ORAL_TABLET | Freq: Every day | ORAL | Status: DC
  Administered 2020-10-16 – 2020-10-17 (×2): 1 via ORAL
  Filled 2020-10-15 (×2): qty 1

## 2020-10-15 MED ORDER — PHENYLEPHRINE HCL-NACL 20-0.9 MG/250ML-% IV SOLN
INTRAVENOUS | Status: AC
  Filled 2020-10-15: qty 250

## 2020-10-15 MED ORDER — SIMETHICONE 80 MG PO CHEW: 80.0000 mg | CHEWABLE_TABLET | ORAL | Status: DC | PRN

## 2020-10-15 MED ORDER — SODIUM CHLORIDE 0.9% FLUSH: 3.0000 mL | INTRAVENOUS | Status: DC | PRN

## 2020-10-15 MED ORDER — PHENYLEPHRINE HCL-NACL 20-0.9 MG/250ML-% IV SOLN
INTRAVENOUS | Status: DC | PRN
  Administered 2020-10-15: 60 ug/min via INTRAVENOUS

## 2020-10-15 MED ORDER — METHYLERGONOVINE MALEATE 0.2 MG/ML IJ SOLN
INTRAMUSCULAR | Status: DC | PRN
  Administered 2020-10-15: .2 mg via INTRAMUSCULAR

## 2020-10-15 MED ORDER — TERBUTALINE SULFATE 1 MG/ML IJ SOLN
INTRAMUSCULAR | Status: AC
  Administered 2020-10-15: 0.25 mg via SUBCUTANEOUS
  Filled 2020-10-15: qty 1

## 2020-10-15 MED ORDER — TRANEXAMIC ACID-NACL 1000-0.7 MG/100ML-% IV SOLN
INTRAVENOUS | Status: DC | PRN
  Administered 2020-10-15: 1000 mg via INTRAVENOUS

## 2020-10-15 MED ORDER — SODIUM CHLORIDE 0.9 % IV SOLN
INTRAVENOUS | Status: AC
  Filled 2020-10-15: qty 2

## 2020-10-15 MED ORDER — OXYCODONE HCL 5 MG PO TABS: 5.0000 mg | ORAL_TABLET | ORAL | Status: DC | PRN

## 2020-10-15 MED ORDER — STERILE WATER FOR IRRIGATION IR SOLN
Status: DC | PRN
  Administered 2020-10-15: 1000 mL

## 2020-10-15 MED ORDER — HYDROMORPHONE HCL 1 MG/ML IJ SOLN: 1.0000 mg | INTRAMUSCULAR | Status: DC | PRN

## 2020-10-15 MED ORDER — FENTANYL CITRATE (PF) 100 MCG/2ML IJ SOLN
INTRAMUSCULAR | Status: DC | PRN
  Administered 2020-10-15: 15 ug via INTRATHECAL

## 2020-10-15 MED ORDER — KETOROLAC TROMETHAMINE 30 MG/ML IJ SOLN
30.0000 mg | Freq: Four times a day (QID) | INTRAMUSCULAR | Status: AC
  Administered 2020-10-15 – 2020-10-16 (×3): 30 mg via INTRAVENOUS
  Filled 2020-10-15 (×3): qty 1

## 2020-10-15 MED ORDER — MAGNESIUM HYDROXIDE 400 MG/5ML PO SUSP: 30.0000 mL | ORAL | Status: DC | PRN

## 2020-10-15 MED ORDER — NALBUPHINE HCL 10 MG/ML IJ SOLN: 5.0000 mg | Freq: Once | INTRAMUSCULAR | Status: DC | PRN

## 2020-10-15 MED ORDER — MENTHOL 3 MG MT LOZG: 1.0000 | LOZENGE | OROMUCOSAL | Status: DC | PRN

## 2020-10-15 MED ORDER — ONDANSETRON HCL 4 MG/2ML IJ SOLN
INTRAMUSCULAR | Status: AC
  Filled 2020-10-15: qty 2

## 2020-10-15 MED ORDER — LACTATED RINGERS IV SOLN: INTRAVENOUS | Status: DC | PRN

## 2020-10-15 MED ORDER — OXYTOCIN-SODIUM CHLORIDE 30-0.9 UT/500ML-% IV SOLN
INTRAVENOUS | Status: DC | PRN
  Administered 2020-10-15: 30 [IU] via INTRAVENOUS

## 2020-10-15 MED ORDER — SCOPOLAMINE 1 MG/3DAYS TD PT72: 1.0000 | MEDICATED_PATCH | Freq: Once | TRANSDERMAL | Status: DC

## 2020-10-15 MED ORDER — TRAMADOL HCL 50 MG PO TABS: 50.0000 mg | ORAL_TABLET | Freq: Four times a day (QID) | ORAL | Status: DC | PRN

## 2020-10-15 MED ORDER — TETANUS-DIPHTH-ACELL PERTUSSIS 5-2.5-18.5 LF-MCG/0.5 IM SUSY: 0.5000 mL | PREFILLED_SYRINGE | Freq: Once | INTRAMUSCULAR | Status: DC

## 2020-10-15 MED ORDER — METHYLERGONOVINE MALEATE 0.2 MG/ML IJ SOLN
INTRAMUSCULAR | Status: AC
  Filled 2020-10-15: qty 1

## 2020-10-15 MED ORDER — FERROUS SULFATE 325 (65 FE) MG PO TABS
325.0000 mg | ORAL_TABLET | ORAL | Status: DC
  Administered 2020-10-17: 325 mg via ORAL
  Filled 2020-10-15 (×2): qty 1

## 2020-10-15 MED ORDER — MEASLES, MUMPS & RUBELLA VAC IJ SOLR: 0.5000 mL | Freq: Once | INTRAMUSCULAR | Status: DC

## 2020-10-15 MED ORDER — TERBUTALINE SULFATE 1 MG/ML IJ SOLN: 0.2500 mg | Freq: Once | INTRAMUSCULAR | Status: AC

## 2020-10-15 MED ORDER — ONDANSETRON HCL 4 MG/2ML IJ SOLN: 4.0000 mg | Freq: Once | INTRAMUSCULAR | Status: DC | PRN

## 2020-10-15 MED ORDER — LEVONORGESTREL 19.5 MCG/DAY IU IUD
INTRAUTERINE_SYSTEM | INTRAUTERINE | Status: AC
  Filled 2020-10-15: qty 1

## 2020-10-15 MED ORDER — CEFAZOLIN SODIUM-DEXTROSE 2-4 GM/100ML-% IV SOLN: 2.0000 g | Freq: Three times a day (TID) | INTRAVENOUS | Status: DC

## 2020-10-15 MED ORDER — DIBUCAINE (PERIANAL) 1 % EX OINT: 1.0000 "application " | TOPICAL_OINTMENT | CUTANEOUS | Status: DC | PRN

## 2020-10-15 MED ORDER — SODIUM CHLORIDE 0.9 % IV SOLN
INTRAVENOUS | Status: DC | PRN
  Administered 2020-10-15: 2 g via INTRAVENOUS

## 2020-10-15 MED ORDER — BUPIVACAINE IN DEXTROSE 0.75-8.25 % IT SOLN
INTRATHECAL | Status: DC | PRN
  Administered 2020-10-15: 12 mg via INTRATHECAL

## 2020-10-15 MED ORDER — FENTANYL CITRATE (PF) 100 MCG/2ML IJ SOLN
100.0000 ug | INTRAMUSCULAR | Status: DC | PRN
  Administered 2020-10-15 (×5): 100 ug via INTRAVENOUS
  Filled 2020-10-15 (×5): qty 2

## 2020-10-15 MED ORDER — OXYCODONE HCL 5 MG PO TABS: 5.0000 mg | ORAL_TABLET | Freq: Once | ORAL | Status: DC | PRN

## 2020-10-15 MED ORDER — ONDANSETRON HCL 4 MG/2ML IJ SOLN
INTRAMUSCULAR | Status: DC | PRN
  Administered 2020-10-15: 4 mg via INTRAVENOUS

## 2020-10-15 MED ORDER — OXYTOCIN-SODIUM CHLORIDE 30-0.9 UT/500ML-% IV SOLN: 2.5000 [IU]/h | INTRAVENOUS | Status: AC

## 2020-10-15 MED ORDER — SODIUM CHLORIDE 0.9 % IR SOLN
Status: DC | PRN
  Administered 2020-10-15: 1

## 2020-10-15 MED ORDER — DIPHENHYDRAMINE HCL 50 MG/ML IJ SOLN
INTRAMUSCULAR | Status: DC | PRN
  Administered 2020-10-15: 12.5 mg via INTRAVENOUS

## 2020-10-15 MED ORDER — DIPHENHYDRAMINE HCL 25 MG PO CAPS: 25.0000 mg | ORAL_CAPSULE | ORAL | Status: DC | PRN

## 2020-10-15 MED ORDER — KETOROLAC TROMETHAMINE 30 MG/ML IJ SOLN
INTRAMUSCULAR | Status: AC
  Filled 2020-10-15: qty 1

## 2020-10-15 MED ORDER — ZOLPIDEM TARTRATE 5 MG PO TABS: 5.0000 mg | ORAL_TABLET | Freq: Every evening | ORAL | Status: DC | PRN

## 2020-10-15 MED ORDER — OXYCODONE HCL 5 MG/5ML PO SOLN: 5.0000 mg | Freq: Once | ORAL | Status: DC | PRN

## 2020-10-15 MED ORDER — TRANEXAMIC ACID-NACL 1000-0.7 MG/100ML-% IV SOLN
INTRAVENOUS | Status: AC
  Filled 2020-10-15: qty 100

## 2020-10-15 MED ORDER — ENOXAPARIN SODIUM 60 MG/0.6ML ~~LOC~~ SOLN
50.0000 mg | SUBCUTANEOUS | Status: DC
  Administered 2020-10-16 – 2020-10-17 (×2): 50 mg via SUBCUTANEOUS
  Filled 2020-10-15 (×3): qty 0.6

## 2020-10-15 MED ORDER — NALOXONE HCL 0.4 MG/ML IJ SOLN: 0.4000 mg | INTRAMUSCULAR | Status: DC | PRN

## 2020-10-15 MED ORDER — LEVONORGESTREL 19.5 MCG/DAY IU IUD: INTRAUTERINE_SYSTEM | Freq: Once | INTRAUTERINE | Status: AC

## 2020-10-15 MED ORDER — DIPHENHYDRAMINE HCL 50 MG/ML IJ SOLN
INTRAMUSCULAR | Status: AC
  Filled 2020-10-15: qty 1

## 2020-10-15 MED ORDER — WITCH HAZEL-GLYCERIN EX PADS: 1.0000 "application " | MEDICATED_PAD | CUTANEOUS | Status: DC | PRN

## 2020-10-15 MED ORDER — KETOROLAC TROMETHAMINE 30 MG/ML IJ SOLN
30.0000 mg | Freq: Once | INTRAMUSCULAR | Status: AC | PRN
  Administered 2020-10-15: 30 mg via INTRAVENOUS

## 2020-10-15 MED ORDER — DIPHENHYDRAMINE HCL 50 MG/ML IJ SOLN: 12.5000 mg | INTRAMUSCULAR | Status: DC | PRN

## 2020-10-15 MED ORDER — SODIUM CHLORIDE 0.9 % IV SOLN
2.0000 g | INTRAVENOUS | Status: DC
  Filled 2020-10-15: qty 2

## 2020-10-15 MED ORDER — HYDROMORPHONE HCL 1 MG/ML IJ SOLN: 0.2500 mg | INTRAMUSCULAR | Status: DC | PRN

## 2020-10-15 MED ORDER — FERROUS SULFATE 325 (65 FE) MG PO TABS: 325.0000 mg | ORAL_TABLET | Freq: Two times a day (BID) | ORAL | Status: DC

## 2020-10-15 MED ORDER — GABAPENTIN 100 MG PO CAPS
300.0000 mg | ORAL_CAPSULE | Freq: Two times a day (BID) | ORAL | Status: DC
  Administered 2020-10-15 – 2020-10-18 (×6): 300 mg via ORAL
  Filled 2020-10-15 (×6): qty 3

## 2020-10-15 MED ORDER — NALOXONE HCL 4 MG/10ML IJ SOLN
1.0000 ug/kg/h | INTRAVENOUS | Status: DC | PRN
  Filled 2020-10-15: qty 5

## 2020-10-15 MED ORDER — LACTATED RINGERS IV SOLN: INTRAVENOUS | Status: DC

## 2020-10-15 MED ORDER — SIMETHICONE 80 MG PO CHEW
80.0000 mg | CHEWABLE_TABLET | ORAL | Status: DC
  Administered 2020-10-15 – 2020-10-18 (×3): 80 mg via ORAL
  Filled 2020-10-15 (×3): qty 1

## 2020-10-15 MED ORDER — FENTANYL CITRATE (PF) 100 MCG/2ML IJ SOLN
INTRAMUSCULAR | Status: AC
  Filled 2020-10-15: qty 2

## 2020-10-15 MED ORDER — MORPHINE SULFATE (PF) 0.5 MG/ML IJ SOLN
INTRAMUSCULAR | Status: AC
  Filled 2020-10-15: qty 10

## 2020-10-15 MED ORDER — MORPHINE SULFATE (PF) 0.5 MG/ML IJ SOLN
INTRAMUSCULAR | Status: DC | PRN
  Administered 2020-10-15: 150 ug via INTRATHECAL

## 2020-10-15 MED ORDER — OXYCODONE-ACETAMINOPHEN 5-325 MG PO TABS
2.0000 | ORAL_TABLET | ORAL | Status: DC | PRN
  Administered 2020-10-16: 2 via ORAL
  Filled 2020-10-15: qty 2

## 2020-10-15 SURGICAL SUPPLY — 31 items
CHLORAPREP W/TINT 26ML (MISCELLANEOUS) ×3 IMPLANT
CLAMP CORD UMBIL (MISCELLANEOUS) IMPLANT
CLOTH BEACON ORANGE TIMEOUT ST (SAFETY) ×3 IMPLANT
DRSG OPSITE POSTOP 4X10 (GAUZE/BANDAGES/DRESSINGS) ×3 IMPLANT
ELECT REM PT RETURN 9FT ADLT (ELECTROSURGICAL) ×3
ELECTRODE REM PT RTRN 9FT ADLT (ELECTROSURGICAL) ×1 IMPLANT
EXTRACTOR VACUUM M CUP 4 TUBE (SUCTIONS) IMPLANT
EXTRACTOR VACUUM M CUP 4' TUBE (SUCTIONS)
GLOVE BIOGEL PI IND STRL 7.0 (GLOVE) ×3 IMPLANT
GLOVE BIOGEL PI INDICATOR 7.0 (GLOVE) ×6
GLOVE ECLIPSE 7.0 STRL STRAW (GLOVE) ×3 IMPLANT
GOWN STRL REUS W/TWL LRG LVL3 (GOWN DISPOSABLE) ×6 IMPLANT
KIT ABG SYR 3ML LUER SLIP (SYRINGE) IMPLANT
NEEDLE HYPO 22GX1.5 SAFETY (NEEDLE) ×3 IMPLANT
NEEDLE HYPO 25X5/8 SAFETYGLIDE (NEEDLE) ×3 IMPLANT
NS IRRIG 1000ML POUR BTL (IV SOLUTION) ×3 IMPLANT
PACK C SECTION WH (CUSTOM PROCEDURE TRAY) ×3 IMPLANT
PAD ABD 7.5X8 STRL (GAUZE/BANDAGES/DRESSINGS) ×3 IMPLANT
PAD OB MATERNITY 4.3X12.25 (PERSONAL CARE ITEMS) ×3 IMPLANT
PENCIL SMOKE EVAC W/HOLSTER (ELECTROSURGICAL) ×3 IMPLANT
RTRCTR C-SECT PINK 25CM LRG (MISCELLANEOUS) IMPLANT
SPONGE GAUZE 4X4 12PLY STER LF (GAUZE/BANDAGES/DRESSINGS) ×6 IMPLANT
SUT PDS AB 0 CTX 36 PDP370T (SUTURE) IMPLANT
SUT PLAIN 2 0 XLH (SUTURE) IMPLANT
SUT VIC AB 0 CTX 36 (SUTURE) ×4
SUT VIC AB 0 CTX36XBRD ANBCTRL (SUTURE) ×2 IMPLANT
SUT VIC AB 4-0 KS 27 (SUTURE) ×3 IMPLANT
SYR CONTROL 10ML LL (SYRINGE) ×3 IMPLANT
TOWEL OR 17X24 6PK STRL BLUE (TOWEL DISPOSABLE) ×3 IMPLANT
TRAY FOLEY W/BAG SLVR 14FR LF (SET/KITS/TRAYS/PACK) ×3 IMPLANT
WATER STERILE IRR 1000ML POUR (IV SOLUTION) ×3 IMPLANT

## 2020-10-15 NOTE — Progress Notes (Signed)
   Arlyce Circle is a 19 y.o. G1P0000 at [redacted]w[redacted]d  admitted for induction of labor due to Post dates. Due date 10/07/2020.  Subjective:  No pain, resting comfortably.   Objective: Vitals:   10/14/20 2304 10/14/20 2354  Resp:  18  Temp:  98.2 F (36.8 C)  TempSrc:  Oral  Weight: 93.8 kg 93.8 kg  Height: 5\' 6"  (1.676 m) 5\' 6"  (1.676 m)   No intake/output data recorded.  FHT:  FHR: 140 bpm, variability: moderate,  accelerations:  Present,  decelerations:  Absent UC:   Irregular  SVE:     1 cm   Labs: Lab Results  Component Value Date   WBC 10.8 (H) 10/14/2020   HGB 9.3 (L) 10/14/2020   HCT 30.6 (L) 10/14/2020   MCV 79.1 (L) 10/14/2020   PLT 272 10/14/2020    Assessment / Plan: early labor, confirmed vertex with 10/16/2020. cervix is 40 effaced so will start with cytotec.   Labor: IOL, will give cytotec Fetal Wellbeing:  Category I Pain Control:  Labor support without medications Anticipated MOD:  NSVD  10/16/2020 10/15/2020, 12:20 AM

## 2020-10-15 NOTE — Discharge Instructions (Signed)
Levonorgestrel intrauterine device (IUD) What is this medicine? LEVONORGESTREL IUD (LEE voe nor jes trel) is a contraceptive (birth control) device. The device is placed inside the uterus by a healthcare professional. It is used to prevent pregnancy. This device can also be used to treat heavy bleeding that occurs during your period. This medicine may be used for other purposes; ask your health care provider or pharmacist if you have questions. COMMON BRAND NAME(S): Kyleena, LILETTA, Mirena, Skyla What should I tell my health care provider before I take this medicine? They need to know if you have any of these conditions:  abnormal Pap smear  cancer of the breast, uterus, or cervix  diabetes  endometritis  genital or pelvic infection now or in the past  have more than one sexual partner or your partner has more than one partner  heart disease  history of an ectopic or tubal pregnancy  immune system problems  IUD in place  liver disease or tumor  problems with blood clots or take blood-thinners  seizures  use intravenous drugs  uterus of unusual shape  vaginal bleeding that has not been explained  an unusual or allergic reaction to levonorgestrel, other hormones, silicone, or polyethylene, medicines, foods, dyes, or preservatives  pregnant or trying to get pregnant  breast-feeding How should I use this medicine? This device is placed inside the uterus by a health care professional. Talk to your pediatrician regarding the use of this medicine in children. Special care may be needed. Overdosage: If you think you have taken too much of this medicine contact a poison control center or emergency room at once. NOTE: This medicine is only for you. Do not share this medicine with others. What if I miss a dose? This does not apply. Depending on the brand of device you have inserted, the device will need to be replaced every 3 to 6 years if you wish to continue using this type  of birth control. What may interact with this medicine? Do not take this medicine with any of the following medications:  amprenavir  bosentan  fosamprenavir This medicine may also interact with the following medications:  aprepitant  armodafinil  barbiturate medicines for inducing sleep or treating seizures  bexarotene  boceprevir  griseofulvin  medicines to treat seizures like carbamazepine, ethotoin, felbamate, oxcarbazepine, phenytoin, topiramate  modafinil  pioglitazone  rifabutin  rifampin  rifapentine  some medicines to treat HIV infection like atazanavir, efavirenz, indinavir, lopinavir, nelfinavir, tipranavir, ritonavir  St. John's wort  warfarin This list may not describe all possible interactions. Give your health care provider a list of all the medicines, herbs, non-prescription drugs, or dietary supplements you use. Also tell them if you smoke, drink alcohol, or use illegal drugs. Some items may interact with your medicine. What should I watch for while using this medicine? Visit your doctor or health care professional for regular check ups. See your doctor if you or your partner has sexual contact with others, becomes HIV positive, or gets a sexual transmitted disease. This product does not protect you against HIV infection (AIDS) or other sexually transmitted diseases. You can check the placement of the IUD yourself by reaching up to the top of your vagina with clean fingers to feel the threads. Do not pull on the threads. It is a good habit to check placement after each menstrual period. Call your doctor right away if you feel more of the IUD than just the threads or if you cannot feel the threads at   all. The IUD may come out by itself. You may become pregnant if the device comes out. If you notice that the IUD has come out use a backup birth control method like condoms and call your health care provider. Using tampons will not change the position of the  IUD and are okay to use during your period. This IUD can be safely scanned with magnetic resonance imaging (MRI) only under specific conditions. Before you have an MRI, tell your healthcare provider that you have an IUD in place, and which type of IUD you have in place. What side effects may I notice from receiving this medicine? Side effects that you should report to your doctor or health care professional as soon as possible:  allergic reactions like skin rash, itching or hives, swelling of the face, lips, or tongue  fever, flu-like symptoms  genital sores  high blood pressure  no menstrual period for 6 weeks during use  pain, swelling, warmth in the leg  pelvic pain or tenderness  severe or sudden headache  signs of pregnancy  stomach cramping  sudden shortness of breath  trouble with balance, talking, or walking  unusual vaginal bleeding, discharge  yellowing of the eyes or skin Side effects that usually do not require medical attention (report to your doctor or health care professional if they continue or are bothersome):  acne  breast pain  change in sex drive or performance  changes in weight  cramping, dizziness, or faintness while the device is being inserted  headache  irregular menstrual bleeding within first 3 to 6 months of use  nausea This list may not describe all possible side effects. Call your doctor for medical advice about side effects. You may report side effects to FDA at 1-800-FDA-1088. Where should I keep my medicine? This does not apply. NOTE: This sheet is a summary. It may not cover all possible information. If you have questions about this medicine, talk to your doctor, pharmacist, or health care provider.  2020 Elsevier/Gold Standard (2018-09-18 13:22:01)   Postpartum Care After Cesarean Delivery This sheet gives you information about how to care for yourself from the time you deliver your baby to up to 6-12 weeks after delivery  (postpartum period). Your health care provider may also give you more specific instructions. If you have problems or questions, contact your health care provider. Follow these instructions at home: Medicines  Take over-the-counter and prescription medicines only as told by your health care provider.  If you were prescribed an antibiotic medicine, take it as told by your health care provider. Do not stop taking the antibiotic even if you start to feel better.  Ask your health care provider if the medicine prescribed to you: ? Requires you to avoid driving or using heavy machinery. ? Can cause constipation. You may need to take actions to prevent or treat constipation, such as:  Drink enough fluid to keep your urine pale yellow.  Take over-the-counter or prescription medicines.  Eat foods that are high in fiber, such as beans, whole grains, and fresh fruits and vegetables.  Limit foods that are high in fat and processed sugars, such as fried or sweet foods. Activity  Gradually return to your normal activities as told by your health care provider.  Avoid activities that take a lot of effort and energy (are strenuous) until approved by your health care provider. Walking at a slow to moderate pace is usually safe. Ask your health care provider what activities are safe for   you. ? Do not lift anything that is heavier than your baby or 10 lb (4.5 kg) as told by your health care provider. ? Do not vacuum, climb stairs, or drive a car for as long as told by your health care provider.  If possible, have someone help you at home until you are able to do your usual activities yourself.  Rest as much as possible. Try to rest or take naps while your baby is sleeping. Vaginal bleeding  It is normal to have vaginal bleeding (lochia) after delivery. Wear a sanitary pad to absorb vaginal bleeding and discharge. ? During the first week after delivery, the amount and appearance of lochia is often similar  to a menstrual period. ? Over the next few weeks, it will gradually decrease to a dry, yellow-brown discharge. ? For most women, lochia stops completely by 4-6 weeks after delivery. Vaginal bleeding can vary from woman to woman.  Change your sanitary pads frequently. Watch for any changes in your flow, such as: ? A sudden increase in volume. ? A change in color. ? Large blood clots.  If you pass a blood clot, save it and call your health care provider to discuss. Do not flush blood clots down the toilet before you get instructions from your health care provider.  Do not use tampons or douches until your health care provider says this is safe.  If you are not breastfeeding, your period should return 6-8 weeks after delivery. If you are breastfeeding, your period may return anytime between 8 weeks after delivery and the time that you stop breastfeeding. Perineal care   If your C-section (Cesarean section) was unplanned, and you were allowed to labor and push before delivery, you may have pain, swelling, and discomfort of the tissue between your vaginal opening and your anus (perineum). You may also have an incision in the tissue (episiotomy) or the tissue may have torn during delivery. Follow these instructions as told by your health care provider: ? Keep your perineum clean and dry as told by your health care provider. Use medicated pads and pain-relieving sprays and creams as directed. ? If you have an episiotomy or vaginal tear, check the area every day for signs of infection. Check for:  Redness, swelling, or pain.  Fluid or blood.  Warmth.  Pus or a bad smell. ? You may be given a squirt bottle to use instead of wiping to clean the perineum area after you go to the bathroom. As you start healing, you may use the squirt bottle before wiping yourself. Make sure to wipe gently. ? To relieve pain caused by an episiotomy, vaginal tear, or hemorrhoids, try taking a warm sitz bath 2-3 times  a day. A sitz bath is a warm water bath that is taken while you are sitting down. The water should only come up to your hips and should cover your buttocks. Breast care  Within the first few days after delivery, your breasts may feel heavy, full, and uncomfortable (breast engorgement). You may also have milk leaking from your breasts. Your health care provider can suggest ways to help relieve breast discomfort. Breast engorgement should go away within a few days.  If you are breastfeeding: ? Wear a bra that supports your breasts and fits you well. ? Keep your nipples clean and dry. Apply creams and ointments as told by your health care provider. ? You may need to use breast pads to absorb milk leakage. ? You may have uterine contractions   every time you breastfeed for several weeks after delivery. Uterine contractions help your uterus return to its normal size. ? If you have any problems with breastfeeding, work with your health care provider or a lactation consultant.  If you are not breastfeeding: ? Avoid touching your breasts as this can make your breasts produce more milk. ? Wear a well-fitting bra and use cold packs to help with swelling. ? Do not squeeze out (express) milk. This causes you to make more milk. Intimacy and sexuality  Ask your health care provider when you can engage in sexual activity. This may depend on your: ? Risk of infection. ? Healing rate. ? Comfort and desire to engage in sexual activity.  You are able to get pregnant after delivery, even if you have not had your period. If desired, talk with your health care provider about methods of family planning or birth control (contraception). Lifestyle  Do not use any products that contain nicotine or tobacco, such as cigarettes, e-cigarettes, and chewing tobacco. If you need help quitting, ask your health care provider.  Do not drink alcohol, especially if you are breastfeeding. Eating and drinking   Drink enough  fluid to keep your urine pale yellow.  Eat high-fiber foods every day. These may help prevent or relieve constipation. High-fiber foods include: ? Whole grain cereals and breads. ? Brown rice. ? Beans. ? Fresh fruits and vegetables.  Take your prenatal vitamins until your postpartum checkup or until your health care provider tells you it is okay to stop. General instructions  Keep all follow-up visits for you and your baby as told by your health care provider. Most women visit their health care provider for a postpartum checkup within the first 3-6 weeks after delivery. Contact a health care provider if you:  Feel unable to cope with the changes that a new baby brings to your life, and these feelings do not go away.  Feel unusually sad or worried.  Have breasts that are painful, hard, or turn red.  Have a fever.  Have trouble holding urine or keeping urine from leaking.  Have little or no interest in activities you used to enjoy.  Have not breastfed at all and you have not had a menstrual period for 12 weeks after delivery.  Have stopped breastfeeding and you have not had a menstrual period for 12 weeks after you stopped breastfeeding.  Have questions about caring for yourself or your baby.  Pass a blood clot from your vagina. Get help right away if you:  Have chest pain.  Have difficulty breathing.  Have sudden, severe leg pain.  Have severe pain or cramping in your abdomen.  Bleed from your vagina so much that you fill more than one sanitary pad in one hour. Bleeding should not be heavier than your heaviest period.  Develop a severe headache.  Faint.  Have blurred vision or spots in your vision.  Have a bad-smelling vaginal discharge.  Have thoughts about hurting yourself or your baby. If you ever feel like you may hurt yourself or others, or have thoughts about taking your own life, get help right away. You can go to your nearest emergency department or  call:  Your local emergency services (911 in the [place]  A suicide crisis helpline, such as the National Suicide Prevention Lifeline at 1-800-273-8255. This is open 24 hours a day. Summary  The period of time from when you deliver your baby to up to 6-12 weeks after delivery is called   the postpartum period.  Gradually return to your normal activities as told by your health care provider.  Keep all follow-up visits for you and your baby as told by your health care provider. This information is not intended to replace advice given to you by your health care provider. Make sure you discuss any questions you have with your health care provider. Document Revised: 06/27/2018 Document Reviewed: 06/27/2018 Elsevier Patient Education  2020 Elsevier Inc.  

## 2020-10-15 NOTE — Anesthesia Procedure Notes (Addendum)
Spinal  Patient location during procedure: OB Start time: 10/15/2020 1:26 PM End time: 10/15/2020 1:33 PM Staffing Performed: anesthesiologist  Anesthesiologist: Trevor Iha, MD Preanesthetic Checklist Completed: patient identified, IV checked, risks and benefits discussed, surgical consent, monitors and equipment checked, pre-op evaluation and timeout performed Spinal Block Patient position: sitting Prep: DuraPrep and site prepped and draped Patient monitoring: heart rate, cardiac monitor, continuous pulse ox and blood pressure Approach: midline Location: L3-4 Injection technique: single-shot Needle Needle type: Pencan  Needle gauge: 24 G Needle length: 10 cm Needle insertion depth: 7 cm Assessment Sensory level: T4 Additional Notes 2 Attempt (s). Pt tolerated procedure well.

## 2020-10-15 NOTE — Op Note (Signed)
Rebecca Bean PROCEDURE DATE: 10/15/2020  PREOPERATIVE DIAGNOSES: Intrauterine pregnancy at [redacted]w[redacted]d weeks gestation; non-reassuring fetal status and fetal intolerance of labor; desires long term reversible contraception  POSTOPERATIVE DIAGNOSES: The same  PROCEDURE: Primary Low Transverse Cesarean Section and Liletta Intrauterine Device Placement  SURGEON:  Dr. Jaynie Collins  ASSISTANT: Dr. Mart Piggs  ANESTHESIOLOGY TEAM: Anesthesiologist: Trevor Iha, MD CRNA: Renford Dills, CRNA  INDICATIONS: Rebecca Bean is a 19 y.o. G1P0 at [redacted]w[redacted]d here for cesarean section secondary to the indications listed under preoperative diagnoses; please see preoperative note for further details.  The risks of surgery were discussed with the patient including but were not limited to: bleeding which may require transfusion or reoperation; infection which may require antibiotics; injury to bowel, bladder, ureters or other surrounding organs; injury to the fetus; need for additional procedures including hysterectomy in the event of a life-threatening hemorrhage; formation of adhesions; placental abnormalities wth subsequent pregnancies; incisional problems; thromboembolic phenomenon and other postoperative/anesthesia complications.  The patient concurred with the proposed plan, giving informed written consent for the procedure.    FINDINGS:  Viable female infant in cephalic presentation.  Apgars 7/7/9. Umbilical cord around arm x 3.  Heavy meconium in copious amniotic fluid.  Intact placenta, three vessel cord.  Some atony noted after delivery ameliorated by administration of pitocin and methergine. Liletta IUD placed prior to hysterotomy closure. Normal uterus, fallopian tubes and ovaries bilaterally.  ANESTHESIA: Spinal ESTIMATED BLOOD LOSS: 700 ml URINE OUTPUT:  150 ml SPECIMENS: Placenta sent to pathology COMPLICATIONS: None immediate  PROCEDURE IN DETAIL:  The patient preoperatively received  intravenous antibiotics and had sequential compression devices applied to her lower extremities.  She was then taken to the operating room where spinal anesthesia was administered and was found to be adequate. She was then placed in a dorsal supine position with a leftward tilt, and prepped and draped in a sterile manner.  A foley catheter was placed into her bladder and attached to constant gravity.  After an adequate timeout was performed, a Pfannenstiel skin incision was made with scalpel and carried through to the underlying layer of fascia. The fascia was incised in the midline, and this incision was extended bilaterally using the Mayo scissors.  Kocher clamps were applied to the superior aspect of the fascial incision and the underlying rectus muscles were dissected off bluntly and sharply.  A similar process was carried out on the inferior aspect of the fascial incision. The rectus muscles were separated in the midline and the peritoneum was entered bluntly. The Alexis self-retaining retractor was introduced into the abdominal cavity.  Attention was turned to the lower uterine segment where a low transverse hysterotomy was made with a scalpel and extended bilaterally bluntly.  The infant was successfully delivered, the cord was clamped and cut after one minute, and the infant was handed over to the awaiting neonatology team. Uterine massage was then administered, and the placenta delivered intact with a three-vessel cord. The uterus was then cleared of clots and debris.   The Liletta IUD was placed in the fundal region, and the strings were pushed through the lower uterine segment into cervix and upper vagina.  The hysterotomy was closed with 0 Vicryl in a running locked fashion, and an imbricating layer was also placed with 0 Vicryl.  Figure-of-eight 0 Vicryl serosal stitches were placed to help with hemostasis.  The pelvis was cleared of all clot and debris. Hemostasis was confirmed on all surfaces.  The  retractor was removed.  The peritoneum was closed with a 0 Vicryl running stitch. The fascia was then closed using 0 Vicryl in a running fashion.  The subcutaneous layer was irrigated, and the skin was closed with a 4-0 Vicryl subcuticular stitch. The patient tolerated the procedure well. Sponge, instrument and needle counts were correct x 3.  She was taken to the recovery room in stable condition.    Jaynie Collins, MD, FACOG Obstetrician & Gynecologist, [facility] for [employer], [facility]

## 2020-10-15 NOTE — Anesthesia Preprocedure Evaluation (Signed)
Anesthesia Evaluation  Patient identified by MRN, date of birth, ID band Patient awake    Reviewed: Allergy & Precautions, NPO status , Patient's Chart, lab work & pertinent test results  Airway Mallampati: II  TM Distance: >3 FB Neck ROM: Full    Dental no notable dental hx. (+) Teeth Intact, Dental Advisory Given   Pulmonary neg pulmonary ROS, former smoker,    Pulmonary exam normal breath sounds clear to auscultation       Cardiovascular Exercise Tolerance: Good negative cardio ROS Normal cardiovascular exam Rhythm:Regular Rate:Normal     Neuro/Psych negative neurological ROS  negative psych ROS   GI/Hepatic negative GI ROS, Neg liver ROS,   Endo/Other  negative endocrine ROS  Renal/GU negative Renal ROS     Musculoskeletal   Abdominal   Peds  Hematology  (+) anemia , Lab Results      Component                Value               Date                      WBC                      10.8 (H)            10/14/2020                HGB                      9.3 (L)             10/14/2020                HCT                      30.6 (L)            10/14/2020                MCV                      79.1 (L)            10/14/2020                PLT                      272                 10/14/2020              Anesthesia Other Findings   Reproductive/Obstetrics (+) Pregnancy                             Anesthesia Physical Anesthesia Plan  ASA: III and emergent  Anesthesia Plan: Spinal   Post-op Pain Management:    Induction:   PONV Risk Score and Plan: Treatment may vary due to age or medical condition  Airway Management Planned: Nasal Cannula and Natural Airway  Additional Equipment: None  Intra-op Plan:   Post-operative Plan:   Informed Consent: I have reviewed the patients History and Physical, chart, labs and discussed the procedure including the risks, benefits and  alternatives for the proposed anesthesia with the patient or authorized representative who has indicated his/her understanding and acceptance.  Plan Discussed with: CRNA and Anesthesiologist  Anesthesia Plan Comments: (41 wk G1P0 w  Anemia an NRFHT for primary C/S)        Anesthesia Quick Evaluation

## 2020-10-15 NOTE — Anesthesia Postprocedure Evaluation (Signed)
Anesthesia Post Note  Patient: [name]  Procedure(s) Performed: CESAREAN SECTION (N/A )     Patient location during evaluation: Mother Baby Anesthesia Type: Spinal Level of consciousness: oriented and awake and alert Pain management: pain level controlled Vital Signs Assessment: post-procedure vital signs reviewed and stable Respiratory status: spontaneous breathing and respiratory function stable Cardiovascular status: blood pressure returned to baseline and stable Postop Assessment: no headache, no backache, no apparent nausea or vomiting and able to ambulate Anesthetic complications: no   No complications documented.  Last Vitals:  Vitals:   10/15/20 1230 10/15/20 1436  BP: 123/69 117/81  Pulse: (!) 108 79  Resp:  (!) 21  Temp:  36.8 C  SpO2:  98%    Last Pain:  Vitals:   10/15/20 1451  TempSrc:   PainSc: 0-No pain   Pain Goal: Patients Stated Pain Goal: 0 (10/15/20 0500)              Epidural/Spinal Function Cutaneous sensation: No Sensation (10/15/20 1436), Patient able to flex knees: No (10/15/20 1436), Patient able to lift hips off bed: No (10/15/20 1436), Back pain beyond tenderness at insertion site: No (10/15/20 1436), Progressively worsening motor and/or sensory loss: No (10/15/20 1436), Bowel and/or bladder incontinence post epidural: No (10/15/20 1436)  Rebecca Bean

## 2020-10-15 NOTE — Transfer of Care (Signed)
Immediate Anesthesia Transfer of Care Note  Patient: Rebecca Bean  Procedure(s) Performed: CESAREAN SECTION (N/A )  Patient Location: PACU  Anesthesia Type:Spinal  Level of Consciousness: awake  Airway & Oxygen Therapy: Patient Spontanous Breathing  Post-op Assessment: Report given to RN and Post -op Vital signs reviewed and stable  Post vital signs: Reviewed and stable  Last Vitals:  Vitals Value Taken Time  BP 117/81 10/15/20 1435  Temp    Pulse 86 10/15/20 1437  Resp 20 10/15/20 1437  SpO2 98 % 10/15/20 1437  Vitals shown include unvalidated device data.  Last Pain:  Vitals:   10/15/20 0939  TempSrc: Oral  PainSc:       Patients Stated Pain Goal: 0 (10/15/20 0500)  Complications: No complications documented.

## 2020-10-15 NOTE — Discharge Summary (Addendum)
Postpartum Discharge Summary    Patient Name: Rebecca Bean DOB: 2001/09/02 MRN: [id number]  Date of admission: 10/14/2020 Delivery date:10/15/2020  Delivering provider: Verita Schneiders A  Date of discharge: 10/18/2020  Admitting diagnosis: Post term pregnancy at [redacted] weeks gestation [O48.0, Z3A.41] Intrauterine pregnancy: [redacted]w[redacted]d    Secondary diagnosis:  Principal Problem:   S/P cesarean section Active Problems:   Gonorrhea in pregnancy, antepartum, first trimester   Chlamydia trachomatis infection in pregnancy in first trimester   Group B Streptococcus carrier, +RV culture, currently pregnant   Alpha thalassemia silent carrier   Maternal iron deficiency anemia complicating pregnancy in third trimester   Post term pregnancy at [redacted] weeks gestation   IUD (intrauterine device) in place  Additional problems: none    Discharge diagnosis: Term Pregnancy Delivered                                              Post partum procedures:intra-op liletta placed Augmentation: Cytotec and IP Foley Complications: None  Hospital course: Induction of Labor With Cesarean Section   19y.o. yo G1P0000 at 443w1das admitted to the hospital 10/14/2020 for induction of labor. Patient had a labor course significant for recurrent late decelerations with cytotec. FB was then placed and dislodged. Patient made some cervical change but continued to have recurrent late decelerations despite terb x2 and LR bolus. Decision made to proceed to cesarean delivery at that time. The patient went for cesarean section due to Non-Reassuring FHR. Delivery details are as follows: Membrane Rupture Time/Date: 1:53 PM ,10/15/2020   Delivery Method:C-Section, Low Transverse  Details of operation can be found in separate operative Note.  Patient had an uncomplicated postpartum course. She is ambulating, tolerating a regular diet, passing flatus, and urinating well.  Patient is discharged home in stable condition on 10/18/20.       Newborn Data: Birth date:10/15/2020  Birth time:1:53 PM  Gender:Female  Living status:Living  Apgars:7 ,7  Weight:3185 g (7lb 0.3oz)                                Magnesium Sulfate received: No BMZ received: No Rhophylac:N/A MMR:N/A T-DaP:pending Flu: No Transfusion:Yes, IV iron  Physical exam  Vitals:   10/15/20 1130 10/15/20 1200 10/15/20 1230 10/15/20 1436  BP: (!) 110/53 (!) 112/54 123/69 117/81  Pulse: 93 93 (!) 108 79  Resp:    (!) 21  Temp:    98.2 F (36.8 C)  TempSrc:    Oral  SpO2:    98%  Weight:      Height:       General: alert, cooperative and no distress Lochia: appropriate Uterine Fundus: firm Incision: Healing well with no significant drainage, No significant erythema, Dressing is clean, dry, and intact DVT Evaluation: No evidence of DVT seen on physical exam. Labs: Lab Results  Component Value Date   WBC 10.8 (H) 10/14/2020   HGB 9.3 (L) 10/14/2020   HCT 30.6 (L) 10/14/2020   MCV 79.1 (L) 10/14/2020   PLT 272 10/14/2020   CMP Latest Ref Rng & Units 10/16/2020  Creatinine 0.44 - 1.00 mg/dL 0.97   Edinburgh Score: Edinburgh Postnatal Depression Scale Screening Tool 10/16/2020  I have been able to laugh and see the funny side of things. 0  I have looked  forward with enjoyment to things. 0  I have blamed myself unnecessarily when things went wrong. 0  I have been anxious or worried for no good reason. 2  I have felt scared or panicky for no good reason. 2  Things have been getting on top of me. 0  I have been so unhappy that I have had difficulty sleeping. 0  I have felt sad or miserable. 0  I have been so unhappy that I have been crying. 0  The thought of harming myself has occurred to me. 0  Edinburgh Postnatal Depression Scale Total 4     After visit meds:  Allergies as of 10/18/2020   No Known Allergies     Medication List    STOP taking these medications   Blood Pressure Kit Devi   docusate sodium 100 MG capsule Commonly  known as: COLACE   Vitafol Gummies 3.33-0.333-34.8 MG Chew     TAKE these medications   ferrous sulfate 325 (65 FE) MG tablet Commonly known as: FerrouSul Take 1 tablet (325 mg total) by mouth every other day.   ibuprofen 800 MG tablet Commonly known as: ADVIL Take 1 tablet (800 mg total) by mouth every 6 (six) hours.   oxyCODONE 5 MG immediate release tablet Commonly known as: Oxy IR/ROXICODONE Take 1 tablet (5 mg total) by mouth every 4 (four) hours as needed for moderate pain.        Discharge home in stable condition Infant Feeding: Bottle Infant Disposition:NICU (for jaundice) Discharge instruction: per After Visit Summary and Postpartum booklet. Activity: Advance as tolerated. Pelvic rest for 6 weeks.  Diet: routine diet Future Appointments:No future appointments. Follow up Visit: Message sent to [facility] 10/15/20.   Please schedule this patient for a In person postpartum visit in 4 weeks with the following provider: Any provider. Additional Postpartum F/U:Incision check 1 week  Low risk pregnancy complicated by: none Delivery mode:  C-Section, Low Transverse  Anticipated Birth Control:  PP IUD placed  Needs string check at postpartum appt.   10/18/2020 Simone Autry-Lott, DO  CNM attestation I have seen and examined this patient and agree with above documentation in the resident's note.   Rebecca Bean is a 19 y.o. G1P0000 s/p pLTCS.   Pain is well controlled.  Plan for birth control is IUD (placed).  Method of Feeding: bottle  PE:  BP 105/70 (BP Location: Left Arm)   Pulse 94   Temp 98.1 F (36.7 C) (Oral)   Resp 18   Ht _0  (1.676 m)   Wt 93.8 kg   LMP 12/12/2019 (Exact Date)   SpO2 100%   BMI 33.36 kg/m  Fundus firm  No results for input(s): HGB, HCT in the last 72 hours.   Plan: discharge today - postpartum care discussed - f/u clinic in 1 week for incision check, then 4 weeks for postpartum visit   Myrtis Ser, CNM 11:22 AM

## 2020-10-15 NOTE — Progress Notes (Addendum)
Labor Progress Note Rebecca Bean is a 19 y.o. G1P0000 at 58w1dpresented for PD-IOL. S: Doing well without complaints.  O:  BP 123/68   Pulse 94   Temp 98.5 F (36.9 C) (Oral)   Resp 17   Ht 5' 6"  (1.676 m)   Wt 93.8 kg   LMP 12/12/2019 (Exact Date)   BMI 33.36 kg/m  EFM: baseline 150bpm/min variability/+ accels/recurrent late decels with contractions Toco: q1 minute  CVE: Dilation: 3 Effacement (%): 40 Cervical Position: Posterior Station: -3 Presentation: Vertex Exam by:: LMerri Brunette RN    A&P: 19y.o. G1P0000 421w1dresented for PD-IOL. #IOL: S/p cytotec x2. Given recurrent late decels terb x1 administered at 0800. FB dislodged. Patient continues to have recurrent late decels despite LR bolus. Will repeat terb x1. Discussed case with Dr. AnHarolyn Rutherfordnd will proceed with cesarean section.  The risks of cesarean section were discussed with the patient including but were not limited to: bleeding which may require transfusion or reoperation; infection which may require antibiotics; injury to bowel, bladder, ureters or other surrounding organs; injury to the fetus; need for additional procedures including hysterectomy in the event of a life-threatening hemorrhage; placental abnormalities wth subsequent pregnancies, incisional problems, thromboembolic phenomenon and other postoperative/anesthesia complications.  The patient concurred with the proposed plan, giving informed written consent for the procedures.  Patient has been NPO since 2130 last night she will remain NPO for procedure. Anesthesia and OR aware.  Preoperative prophylactic antibiotics and SCDs ordered on call to the OR.  To OR when ready.  #Pain: spinal #FWB: cat 2 but overall reassuring #GBS positive urine 03/10/20, adequate ppx PCN #Anemia of pregnancy: admit hgb 9.3, consider supplementation pp pending post op cbc #contraception: desires post placental liletta, previously ordered/consented. Will place in  OR.  AlArrie SenateMD 12:21 PM    Attestation of Attending Supervision of Obstetric Fellow: Evaluation and management procedures were performed by the Obstetric Fellow under my supervision and collaboration.  I have reviewed the Obstetric Fellow's note and chart, and I agree with the management and plan. I have also made any necessary editorial changes.  FHR reassuring after terbutaline with moderate variability, no accelerations and no decelerations. However, there is ample evidence of fetal intolerance of labor, unable to proceed with any induction/augmentation agent. Will proceed with cesarean delivery as discussed.  Patient met, re-counseled about the procedures, all questions answered. To OR when ready.   UGVerita SchneidersMD, FAAlleganttending ObArthurFaBaptist Memorial Hospitalor [facility] 10/15/2020  1:09 PM

## 2020-10-15 NOTE — Progress Notes (Signed)
Labor Progress Note Rebecca Bean is a 19 y.o. G1P0000 at [redacted]w[redacted]d presented for PD-IOL. S: Doing well without complaints.  O:  BP 113/74   Pulse (!) 102   Temp 98.5 F (36.9 C) (Oral)   Resp 17   Ht 5\' 6"  (1.676 m)   Wt 93.8 kg   LMP 12/12/2019 (Exact Date)   BMI 33.36 kg/m  EFM: baseline 150bpm/mod variability/+ accels/intermittent late decels with contractions Toco: q1 minute  CVE: Dilation: 1 Effacement (%): 40 Cervical Position: Posterior Station: -3 Presentation: Vertex Exam by:: K,Kooistra CNM   A&P: 19 y.o. G1P0000 [redacted]w[redacted]d presented for PD-IOL. #IOL: S/p cytotec x2. Given recurrent late decels terb x1 administered at 0800. FB risks/benefits discussed with patient and placed without difficulty. Will initiate pitocin when FB dislodges. #Pain: PRN #FWB: cat 2 but overall reassuring, resolution of late decels with terb #GBS positive urine 03/10/20, adequate ppx PCN #Anemia of pregnancy: admit hgb 9.3, consider supplementation pp #contraception: desires post placental liletta, discussed risks/benefits and all questions answered. Previously ordered. Consents signed with RN and patient.  03/12/20, MD 8:42 AM

## 2020-10-15 NOTE — Progress Notes (Signed)
° °  Rebecca Bean is a 19 y.o. G1P0000 at [redacted]w[redacted]d  admitted for postdates IOL.   Subjective: Patient sleeping in bed, has been using IV fentanyl for pain relief.   Objective: Vitals:   10/15/20 0230 10/15/20 0300 10/15/20 0330 10/15/20 0400  BP: 108/70 119/71 112/65 108/64  Pulse: 84 91 81 83  Resp:      Temp:      TempSrc:      Weight:      Height:       No intake/output data recorded.  FHT:  FHR: 145 bpm, variability: moderate,  accelerations:  Present,  decelerations:  Present occasionally patient has late decels but non-repetitive and without consistent contraction pattern it is difficult to say if decels are truly decels or if just acels with return to baseline UC:   irregular, every 1 -10 minutes SVE:   Dilation: 1 Effacement (%): 40 Station: -3 Exam by:: JBurris RN   Labs: Lab Results  Component Value Date   WBC 10.8 (H) 10/14/2020   HGB 9.3 (L) 10/14/2020   HCT 30.6 (L) 10/14/2020   MCV 79.1 (L) 10/14/2020   PLT 272 10/14/2020    Assessment / Plan: Cervix feels long, posterior, external os is 1 cm but internal os feels closed.   Labor: will give next dose of cytotec, place FB in AM Fetal Wellbeing:  continue to monitor closely, mod var is reassuring . RN to continue position changes and notify provider of concerning fetal status.  Pain Control:  IV pain meds Anticipated MOD:  NSVD  Charlesetta Garibaldi [facility] 10/15/2020, 4:41 AM

## 2020-10-16 ENCOUNTER — Encounter (HOSPITAL_COMMUNITY): Payer: Self-pay | Admitting: Obstetrics & Gynecology

## 2020-10-16 DIAGNOSIS — D62 Acute posthemorrhagic anemia: Secondary | ICD-10-CM | POA: Diagnosis not present

## 2020-10-16 LAB — CREATININE, SERUM
Creatinine, Ser: 0.97 mg/dL (ref 0.44–1.00)
GFR, Estimated: >60 mL/min (ref >60)

## 2020-10-16 LAB — CBC
HCT: 23.7 % — ABNORMAL LOW (ref 36.0–46.0)
Hemoglobin: 7.5 g/dL — ABNORMAL LOW (ref 12.0–15.0)
MCH: 24.6 pg — ABNORMAL LOW (ref 26.0–34.0)
MCHC: 31.6 g/dL (ref 30.0–36.0)
MCV: 77.7 fL — ABNORMAL LOW (ref 80.0–100.0)
Platelets: 241 10*3/uL (ref 150–400)
RBC: 3.05 MIL/uL — ABNORMAL LOW (ref 3.87–5.11)
RDW: 15.3 % (ref 11.5–15.5)
WBC: 13.1 10*3/uL — ABNORMAL HIGH (ref 4.0–10.5)
nRBC: 0 % (ref 0.0–0.2)

## 2020-10-16 MED ORDER — SODIUM CHLORIDE 0.9 % IV SOLN
500.0000 mg | Freq: Once | INTRAVENOUS | Status: AC
  Administered 2020-10-16: 500 mg via INTRAVENOUS
  Filled 2020-10-16: qty 25

## 2020-10-16 NOTE — Progress Notes (Addendum)
POSTPARTUM PROGRESS NOTE  Subjective: Rebecca Bean is a 19 y.o. G1P0000 s/p LTCS at [redacted]w[redacted]d.  She reports she doing well. No acute events overnight. She denies any problems with ambulating, voiding or po intake. Denies nausea or vomiting. She has  passed flatus. Pain is well controlled.  Lochia is moderate,but improved from yesterday.  Objective: Blood pressure 118/68, pulse 80, temperature 99.2 F (37.3 C), temperature source Oral, resp. rate 20, height 5\' 6"  (1.676 m), weight 93.8 kg, last menstrual period 12/12/2019, SpO2 100 %.  Physical Exam:  General: alert, cooperative and no distress Chest: no respiratory distress Abdomen: soft, non-tender  Uterine Fundus: firm and at level of umbilicus Extremities: No calf swelling or tenderness  No edema  Recent Labs    10/14/20 2341 10/16/20 0534  HGB 9.3* 7.5*  HCT 30.6* 23.7*    Assessment/Plan: Rebecca Bean is a 19 y.o. G1P0000 s/p pLTCS at [redacted]w[redacted]d for NRFHT.  Routine Postpartum Care: Doing well, pain well-controlled.  -- Continue routine care, lactation support  -- Contraception: pp [redacted]w[redacted]d has been placed -- Feeding: bottle  Dispo: Plan for discharge home tomorrrow.  Penni Bombard, MD 10/16/2020 8:01 AM   GME ATTESTATION:  I saw and evaluated the patient. I agree with the findings and the plan of care as documented in the resident's note.  10/18/2020, MD OB Fellow, [facility] 10/16/2020 8:29 AM

## 2020-10-17 NOTE — Progress Notes (Signed)
POSTPARTUM PROGRESS NOTE  Subjective: Rebecca Bean is a 19 y.o. G1P0000 s/p LTCS at [redacted]w[redacted]d. POD#2.  She reports she doing well. No acute events overnight. She denies any problems with ambulating, voiding or po intake. Denies nausea or vomiting. She has had a BM. Pain is well controlled.  Lochia is moderate. Baby getting phototherapy, can't discharge today.   Objective: Blood pressure 116/75, pulse 89, temperature 98.6 F (37 C), temperature source Oral, resp. rate 16, height 5\' 6"  (1.676 m), weight 93.8 kg, last menstrual period 12/12/2019, SpO2 100 %.  Physical Exam:  General: alert, cooperative and no distress Chest: no respiratory distress Abdomen: soft, non-tender  Uterine Fundus: firm and at level of umbilicus. Incision honeycomb is clean/dry/intact.  Extremities: No calf swelling or tenderness  No edema  Recent Labs    10/14/20 2341 10/16/20 0534  HGB 9.3* 7.5*  HCT 30.6* 23.7*    Assessment/Plan: Rebecca Bean is a 19 y.o. G1P0000 s/p pLTCS at [redacted]w[redacted]d for NRFHT. POD#2.   Routine Postpartum Care: Doing well, pain well-controlled.  -- Continue routine care, lactation support  -- Anemia, likely multifactorial in setting of iron deficiency and acute blood loss. PreOp hgb 9.3, PostOp 7.5. S/p IV venofer. Continue PO iron.  -- Contraception: pp [redacted]w[redacted]d has been placed -- Feeding: bottle  Dispo: Plan for discharge home tomorrrow.  Penni Bombard, MD 10/17/2020 6:57 AM

## 2020-10-18 MED ORDER — IBUPROFEN 800 MG PO TABS
800.0000 mg | ORAL_TABLET | Freq: Four times a day (QID) | ORAL | 0 refills | Status: DC
Start: 2020-10-18 — End: 2022-03-07

## 2020-10-18 MED ORDER — OXYCODONE HCL 5 MG PO TABS: 5.0000 mg | ORAL_TABLET | ORAL | 0 refills | Status: DC | PRN

## 2020-10-19 LAB — SURGICAL PATHOLOGY

## 2020-10-27 ENCOUNTER — Ambulatory Visit (INDEPENDENT_AMBULATORY_CARE_PROVIDER_SITE_OTHER): Payer: Medicaid Other

## 2020-10-27 ENCOUNTER — Other Ambulatory Visit: Payer: Self-pay

## 2020-10-27 VITALS — BP 103/71 | HR 93 | Wt 182.0 lb

## 2020-10-27 DIAGNOSIS — Z5189 Encounter for other specified aftercare: Secondary | ICD-10-CM

## 2020-10-27 NOTE — Progress Notes (Signed)
Subjective:     Rebecca Bean is a 19 y.o. female who presents to the clinic 1 weeks status post LTCS for Incision Check. Eating a regular diet without difficulty. Bowel movements are normal. The patient is not having any pain.  Review of Systems Pertinent items are noted in HPI.    Objective:    BP 103/71   Pulse 93   Wt 182 lb (82.6 kg)   LMP 12/12/2019 (Exact Date)   Breastfeeding No   BMI 29.38 kg/m  General:  alert  Abdomen: soft, bowel sounds active, non-tender  Incision:   healing well, no drainage, no erythema, no hernia, no seroma, no swelling, no dehiscence, incision well approximated     Assessment:    Doing well postoperatively.   Plan:    1. Continue any current medications. 2. Wound care discussed. 3. Activity restrictions: none 4. Anticipated return to work: not applicable. 5. Follow up: 11/12/20  for PP visit.   Maretta Bees, RMA

## 2020-10-27 NOTE — Progress Notes (Signed)
Patient was assessed and managed by nursing staff during this encounter. I have reviewed the chart and agree with the documentation and plan. I have also made any necessary editorial changes.  Jaynie Collins, MD 10/27/2020 6:03 PM

## 2020-10-28 ENCOUNTER — Inpatient Hospital Stay (HOSPITAL_COMMUNITY)
Admission: AD | Admit: 2020-10-28 | Discharge: 2020-10-28 | Disposition: A | Discharge status: Home / Self Care | Payer: Medicaid Other | Attending: Obstetrics & Gynecology | Admitting: Obstetrics & Gynecology

## 2020-10-28 ENCOUNTER — Other Ambulatory Visit: Payer: Self-pay

## 2020-10-28 ENCOUNTER — Encounter (HOSPITAL_COMMUNITY): Payer: Self-pay | Admitting: Obstetrics & Gynecology

## 2020-10-28 DIAGNOSIS — O99013 Anemia complicating pregnancy, third trimester: Secondary | ICD-10-CM

## 2020-10-28 DIAGNOSIS — D509 Iron deficiency anemia, unspecified: Secondary | ICD-10-CM | POA: Diagnosis not present

## 2020-10-28 DIAGNOSIS — O900 Disruption of cesarean delivery wound: Secondary | ICD-10-CM | POA: Diagnosis not present

## 2020-10-28 DIAGNOSIS — O9089 Other complications of the puerperium, not elsewhere classified: Secondary | ICD-10-CM | POA: Insufficient documentation

## 2020-10-28 DIAGNOSIS — Z87891 Personal history of nicotine dependence: Secondary | ICD-10-CM | POA: Insufficient documentation

## 2020-10-28 DIAGNOSIS — O9903 Anemia complicating the puerperium: Secondary | ICD-10-CM | POA: Diagnosis not present

## 2020-10-28 DIAGNOSIS — Z98891 History of uterine scar from previous surgery: Secondary | ICD-10-CM

## 2020-10-28 NOTE — MAU Note (Signed)
Pt had primary c/s 11/25 due to failure to progress and fetal intolerance. Awoke at 1900 tonight and incision was leaking pus. Has knot on R side. Was seen at [facility] yesterday and told incision was ok. Incision is not painful except on R side.

## 2020-10-28 NOTE — Discharge Instructions (Signed)
Seroma A seroma is a collection of fluid on the body that looks like swelling or a mass. Seromas form where tissue has been injured or cut. Seromas vary in size. Some are small and painless. Others may become large and cause pain or discomfort. Many seromas go away on their own as the fluid is naturally absorbed by the body, and some seromas need to be drained. What are the causes? Seromas form as the result of damage to tissue or the removal of tissue. This tissue damage may occur during surgery or because of an injury or trauma. When tissue is disrupted or removed, empty space is created. The body's natural defense system (immune system) causes fluid to enter the empty space and form a seroma. What are the signs or symptoms? Symptoms of this condition include:  Swelling at the site of a surgical cut (incision) or an injury.  Drainage of clear fluid at the surgery or injury site.  Discomfort or pain. How is this diagnosed? This condition is diagnosed based on your symptoms, your medical history, and a physical exam. During the exam, your health care provider will press on the seroma. You may also have tests, including:  Blood tests.  Imaging tests, such as an ultrasound or CT scan. How is this treated? Some seromas go away (resolve) on their own. Your health care provider may monitor you to make sure the seroma does not cause any complications. If your seroma does not resolve on its own, treatment may include:  Using a needle to drain the fluid from the seroma (needle aspiration).  Inserting a flexible tube (catheter) to drain the fluid.  Applying a bandage (dressing), such as an elastic bandage or binder.  Antibiotic medicines, if the seroma becomes infected. In rare cases, surgery may be done to remove the seroma and repair the area. Follow these instructions at home:   If you were prescribed an antibiotic medicine, take it as told by your health care provider. Do not stop taking  the antibiotic even if you start to feel better.  Return to your normal activities as told by your health care provider. Ask your health care provider what activities are safe for you.  Take over-the-counter and prescription medicines only as told by your health care provider.  Check your seroma every day for signs of infection. Check for: ? Redness or pain. ? Fluid or pus. ? More swelling. ? Warmth.  Keep all follow-up visits as told by your health care provider. This is important. Contact a health care provider if:  You have a fever.  You have redness or pain at the site of the seroma.  You have fluid or pus coming from the seroma.  Your seroma is more swollen or is getting bigger.  Your seroma is warm to the touch. This information is not intended to replace advice given to you by your health care provider. Make sure you discuss any questions you have with your health care provider. Document Revised: 10/20/2017 Document Reviewed: 08/19/2016 Elsevier Patient Education  2020 Elsevier Inc.  

## 2020-10-28 NOTE — MAU Provider Note (Addendum)
Chief Complaint:  Post-op Problem   First Provider Initiated Contact with Patient 10/28/20 2205       HPI: Rebecca Bean is a 19 y.o. G1P0000 who presents to maternity admissions reporting leaking of fluid/pus at right edge of incision. Seen yesterday in office and incision was fine. . She reports vaginal bleeding, no vaginal itching/burning, urinary symptoms, h/a, dizziness, n/v, or fever/chills.    Other This is a new problem. The current episode started today. The problem occurs intermittently. The problem has been unchanged. Pertinent negatives include no chills, fever, myalgias or nausea. Nothing aggravates the symptoms. She has tried nothing for the symptoms.   RN Note: Pt had primary c/s 11/25 due to failure to progress and fetal intolerance. Awoke at 1900 tonight and incision was leaking pus. Has knot on R side. Was seen at [facility] yesterday and told incision was ok. Incision is not painful except on R side.  Past Medical History: Past Medical History:  Diagnosis Date  . Heart murmur     Past obstetric history: OB History  Gravida Para Term Preterm AB Living  1 0 0 0 0    SAB TAB Ectopic Multiple Live Births  0 0 0        # Outcome Date GA Lbr Len/2nd Weight Sex Delivery Anes PTL Lv  1 Gravida             Past Surgical History: Past Surgical History:  Procedure Laterality Date  . CESAREAN SECTION N/A 10/15/2020   Procedure: CESAREAN SECTION;  Surgeon: Tereso Newcomer, MD;  Location: [facility] LD [facility]S;  Service: Obstetrics;  Laterality: N/A;  . NO PAST SURGERIES      Family History: Family History  Problem Relation Age of Onset  . Hypertension Maternal Grandmother     Social History: Social History   Tobacco Use  . Smoking status: Former Games developer  . Smokeless tobacco: Never Used  Vaping Use  . Vaping Use: Never used  Substance Use Topics  . Alcohol use: No  . Drug use: No    Allergies: No Known Allergies  Meds:  Medications Prior to Admission   Medication Sig Dispense Refill Last Dose  . ibuprofen (ADVIL) 800 MG tablet Take 1 tablet (800 mg total) by mouth every 6 (six) hours. 30 tablet 0 Past Month at Unknown time  . ferrous sulfate (FERROUSUL) 325 (65 FE) MG tablet Take 1 tablet (325 mg total) by mouth every other day. (Patient not taking: Reported on 10/06/2020) 60 tablet 3   . oxyCODONE (OXY IR/ROXICODONE) 5 MG immediate release tablet Take 1 tablet (5 mg total) by mouth every 4 (four) hours as needed for moderate pain. 10 tablet 0     I have reviewed patient's Past Medical Hx, Surgical Hx, Family Hx, Social Hx, medications and allergies.  ROS:  Review of Systems  Constitutional: Negative for chills and fever.  Gastrointestinal: Negative for nausea.  Musculoskeletal: Negative for myalgias.   Other systems negative     Physical Exam   Patient Vitals for the past 24 hrs:  BP Temp Pulse Resp Height Weight  10/28/20 2100 125/66 -- 87 -- -- --  10/28/20 2059 -- 98.7 F (37.1 C) -- 18 5\' 6"  (1.676 m) 83 kg   Constitutional: Well-developed, well-nourished female in no acute distress.  Cardiovascular: normal rate and rhythm, no ectopy audible, S1 & S2 heard, no murmur Respiratory: normal effort, no distress. Lungs CTAB with no wheezes or crackles GI: Abd soft, non-tender.  Nondistended.  No rebound, No guarding.        Incision healing.  Edges well-approximated but there is a tiny area on right edge with serosanguinous drainage. No erethema.  Some induration all along incision c/w healing MS: Extremities nontender, no edema, normal ROM Neurologic: Alert and oriented x 4.   Grossly nonfocal. GU: Neg CVAT. Skin:  Warm and Dry Psych:  Affect appropriate.     Labs: No results found for this or any previous visit (from the past 24 hour(s)). --/--/O POS (11/24 2341)  Imaging:  No results found.  MAU Course/MDM: Attempted to probe area of drainage, unable to get swab into it.  Drops of drainage expressed, no larger  amounts.   Treatments in MAU included Dressing applied. Discussed possible ealry dehiscence, signs of worsening.   Will have her go early next week for RN incision check. .   Pt stable at time of discharge.  Assessment: 1. S/P cesarean section   2. Maternal iron deficiency anemia complicating pregnancy in third trimester   3.     Small area of wound dehiscence/ seroma  Plan: Discharge home Recommend watch for signs of worsening or infection Message sent to get office appt next week for incision check  Encouraged to return here or to other Urgent Care/ED if she develops worsening of symptoms, increase in pain, fever, or other concerning symptoms.   Wynelle Bourgeois CNM, MSN Certified Nurse-Midwife 10/28/2020 10:06 PM

## 2020-11-02 ENCOUNTER — Ambulatory Visit: Payer: Medicaid Other

## 2020-11-12 ENCOUNTER — Ambulatory Visit (INDEPENDENT_AMBULATORY_CARE_PROVIDER_SITE_OTHER): Payer: Medicaid Other | Admitting: Nurse Practitioner

## 2020-11-12 ENCOUNTER — Other Ambulatory Visit: Payer: Self-pay

## 2020-11-12 ENCOUNTER — Encounter: Payer: Self-pay | Admitting: Nurse Practitioner

## 2020-11-12 DIAGNOSIS — Z23 Encounter for immunization: Secondary | ICD-10-CM

## 2020-11-12 DIAGNOSIS — Z975 Presence of (intrauterine) contraceptive device: Secondary | ICD-10-CM

## 2020-11-12 DIAGNOSIS — Z98891 History of uterine scar from previous surgery: Secondary | ICD-10-CM

## 2020-11-12 DIAGNOSIS — Z30431 Encounter for routine checking of intrauterine contraceptive device: Secondary | ICD-10-CM

## 2020-11-12 DIAGNOSIS — Z6829 Body mass index (BMI) 29.0-29.9, adult: Secondary | ICD-10-CM

## 2020-11-12 NOTE — Progress Notes (Signed)
..     Post Partum Visit Note  Rebecca Bean is a 19 y.o. G43P0000 female who presents for a postpartum visit. She is 4 weeks postpartum following a primary cesarean section.  I have fully reviewed the prenatal and intrapartum course. The delivery was at 41.1 gestational weeks.  Anesthesia: spinal. Postpartum course has been good. Baby is doing well. Baby is feeding by bottle Rush Barer. Bleeding no bleeding. Bowel function is normal. Bladder function is normal. Patient is not sexually active. Contraception method is IUD. Postpartum depression screening: negative.   The pregnancy intention screening data noted above was reviewed. Potential methods of contraception were discussed. The patient elected to proceed with IUD or IUS.      The following portions of the patient's history were reviewed and updated as appropriate: allergies, current medications, past family history, past medical history, past social history, past surgical history and problem list.  Review of Systems Pertinent items noted in HPI and remainder of comprehensive ROS otherwise negative.    Objective:  BP 130/72   Pulse 83   Ht 5\' 6"  (1.676 m)   Wt 182 lb 9.6 oz (82.8 kg)   BMI 29.47 kg/m    General:  alert, cooperative and no distress   Breasts:  not examined  Lungs: clear to auscultation bilaterally  Heart:  regular rate and rhythm, S1, S2 normal, no murmur, click, rub or gallop  Abdomen: soft, nontender, C/S incision fully healed\   Vulva:  normal  Vagina: normal vagina  Cervix:  no lesions IUD strings seen curled into the vagina, strings trimmed to 3 mc in length, no part of IUD body palpated in bimanual exam  Corpus: normal  Adnexa:  normal adnexa  Rectal Exam: Not performed.        Assessment:    Normal postpartum exam. Pap smear not done and not indicated by age at today's visit.   Plan:   Essential components of care per ACOG recommendations:  1.  Mood and well being: Patient with negative  depression screening today. Reviewed local resources for support.  - Patient does not use tobacco. - hx of drug use? No    2. Infant care and feeding:  -Patient currently breastmilk feeding? No  -Social determinants of health (SDOH) reviewed in EPIC. No concerns  3. Sexuality, contraception and birth spacing - Patient does not want a pregnancy in the next year.-Has IUD already - Discussed birth spacing of 18 months  4. Sleep and fatigue -Encouraged family/partner/community support of 4 hrs of uninterrupted sleep to help with mood and fatigue  5. Physical Recovery  - Discussed patients delivery and complications - Patient had a small area that was leaking in her C/S incision but that has not completely healed- Patient has urinary incontinence? No  - Patient is safe to resume physical and sexual activity  6.  Health Maintenance Will get TDAP today and wants Covid vaccine - will give TDAP in the office and advised that she see [facility] or [facility] for Covid vaccine   , RN Center for [facility] Stack, [facility]  [place], RN, MSN, NP-BC Nurse Practitioner, [facility]ol[facility] for Biochemist, clinical, [facility] 11/12/2020 11:04 AM

## 2021-01-01 ENCOUNTER — Ambulatory Visit (HOSPITAL_COMMUNITY): Admit: 2021-01-01 | Discharge status: Against Medical Advice | Payer: Medicaid Other | Source: Home / Self Care

## 2021-02-09 ENCOUNTER — Ambulatory Visit: Payer: Medicaid Other | Admitting: Obstetrics

## 2021-04-15 ENCOUNTER — Ambulatory Visit (HOSPITAL_COMMUNITY): Payer: Self-pay

## 2021-04-28 ENCOUNTER — Encounter: Payer: Self-pay | Admitting: Obstetrics

## 2021-04-28 ENCOUNTER — Other Ambulatory Visit: Payer: Self-pay

## 2021-04-28 ENCOUNTER — Ambulatory Visit (INDEPENDENT_AMBULATORY_CARE_PROVIDER_SITE_OTHER): Payer: Medicaid Other | Admitting: Obstetrics

## 2021-04-28 VITALS — BP 124/77 | HR 78 | Ht 66.0 in | Wt 186.0 lb

## 2021-04-28 DIAGNOSIS — Z30432 Encounter for removal of intrauterine contraceptive device: Secondary | ICD-10-CM

## 2021-04-28 DIAGNOSIS — Z30016 Encounter for initial prescription of transdermal patch hormonal contraceptive device: Secondary | ICD-10-CM

## 2021-04-28 DIAGNOSIS — E669 Obesity, unspecified: Secondary | ICD-10-CM

## 2021-04-28 DIAGNOSIS — Z3009 Encounter for other general counseling and advice on contraception: Secondary | ICD-10-CM

## 2021-04-28 MED ORDER — XULANE 150-35 MCG/24HR TD PTWK: 1.0000 | MEDICATED_PATCH | TRANSDERMAL | 12 refills | Status: DC

## 2021-04-28 NOTE — Progress Notes (Signed)
GYN presents for IUD removal, it was inserted PP 10/15/2020.  She has been bleeding since delivery and wants to change to the Patch.

## 2021-04-28 NOTE — Progress Notes (Signed)
    GYNECOLOGY OFFICE PROCEDURE NOTE  Monya Kozakiewicz is a 20 y.o. G1P1001 here for Liletta IUD removal. No GYN concerns.   IUD Removal  Patient identified, informed consent performed, consent signed.  Patient was in the dorsal lithotomy position, normal external genitalia was noted.  A speculum was placed in the patient's vagina, normal discharge was noted, no lesions. The cervix was visualized, no lesions, no abnormal discharge.  The strings of the IUD were grasped and pulled using long curved dressing forceps. The IUD was removed in its entirety.   Patient tolerated the procedure well.    Patient will use the Xulane Patch for contraception.  Routine preventative health maintenance measures emphasized.   Brock Bad, MD, FACOG Obstetrician & Gynecologist, [facility] for [facility], [facility] 04/28/21

## 2021-05-30 ENCOUNTER — Ambulatory Visit (HOSPITAL_COMMUNITY): Admit: 2021-05-30 | Discharge status: Against Medical Advice | Payer: Medicaid Other

## 2021-08-25 ENCOUNTER — Ambulatory Visit (HOSPITAL_COMMUNITY): Payer: Self-pay

## 2021-09-06 ENCOUNTER — Ambulatory Visit (HOSPITAL_COMMUNITY): Payer: Medicaid Other

## 2021-11-05 ENCOUNTER — Ambulatory Visit: Payer: Medicaid Other

## 2021-11-06 ENCOUNTER — Emergency Department (INDEPENDENT_AMBULATORY_CARE_PROVIDER_SITE_OTHER)
Admission: EM | Admit: 2021-11-06 | Discharge: 2021-11-06 | Disposition: A | Discharge status: Home / Self Care | Payer: Medicaid Other | Source: Home / Self Care

## 2021-11-06 ENCOUNTER — Other Ambulatory Visit: Payer: Self-pay

## 2021-11-06 DIAGNOSIS — N898 Other specified noninflammatory disorders of vagina: Secondary | ICD-10-CM

## 2021-11-06 MED ORDER — VALACYCLOVIR HCL 1 G PO TABS: ORAL_TABLET | ORAL | 0 refills | Status: DC

## 2021-11-06 NOTE — Discharge Instructions (Addendum)
Strongly encourage patient to follow-up with PCP or OB/GYN to have HSV-2 (herpes culture taken of this lesion to confirm either way).  Advised patient to take medication as directed with food to completion.  Encourage patient increase daily water intake while taking these medications.

## 2021-11-06 NOTE — ED Triage Notes (Signed)
Pt presents to Urgent Care with c/o vaginal lesions/"sores" x 5 days. Reports having unprotected sex around the first of the month w/ same partner.

## 2021-11-06 NOTE — ED Provider Notes (Signed)
[facility]    CSN: [id number] Arrival date & time: 11/06/21  1330      History   Chief Complaint Chief Complaint  Patient presents with   vaginal lesion    HPI Rebecca Bean is a 20 y.o. female.   HPI 20 year old female presents with vaginal lesions and sores x 5 days.  Reports having unprotected sex around the first of the month with same partner.  Patient has taken pictures of these lesions to show [place] today here.  Patient reports these lesions are nonpainful without discharge.  Past Medical History:  Diagnosis Date   Heart murmur     Patient Active Problem List   Diagnosis Date Noted   IUD (intrauterine device) in place 10/15/2020   Alpha thalassemia silent carrier 03/23/2020   History of C-section 02/10/2020    Past Surgical History:  Procedure Laterality Date   CESAREAN SECTION N/A 10/15/2020   Procedure: CESAREAN SECTION;  Surgeon: Tereso Newcomer, MD;  Location: [facility] LD ORS;  Service: Obstetrics;  Laterality: N/A;   NO PAST SURGERIES      OB History     Gravida  1   Para  1   Term  1   Preterm  0   AB  0   Living  1      SAB  0   IAB  0   Ectopic  0   Multiple      Live Births  1            Home Medications    Prior to Admission medications   Medication Sig Start Date End Date Taking? Authorizing Provider  valACYclovir (VALTREX) 1000 MG tablet Take 2 tabs p.o. daily x 5 days 11/06/21  Yes Trevor Iha, FNP  ibuprofen (ADVIL) 800 MG tablet Take 1 tablet (800 mg total) by mouth every 6 (six) hours. Patient not taking: Reported on 04/28/2021 10/18/20   Autry-Lott, Randa Evens, DO  Burr Medico 150-35 MCG/24HR transdermal patch Place 1 patch onto the skin once a week. 04/28/21   Brock Bad, MD    Family History Family History  Problem Relation Age of Onset   Healthy Mother    Healthy Father    Hypertension Maternal Grandmother     Social History Social History   Tobacco Use   Smoking status: Former   Smokeless  tobacco: Never  Building services engineer Use: Never used  Substance Use Topics   Alcohol use: No   Drug use: No     Allergies   Patient has no known allergies.   Review of Systems Review of Systems  Genitourinary:        Vaginal lesions x 5 days  All other systems reviewed and are negative.   Physical Exam Triage Vital Signs ED Triage Vitals  Enc Vitals Group     BP 11/06/21 1550 127/76     Pulse Rate 11/06/21 1550 90     Resp 11/06/21 1550 20     Temp 11/06/21 1550 98.7 F (37.1 C)     Temp Source 11/06/21 1550 Oral     SpO2 11/06/21 1550 100 %     Weight 11/06/21 1545 192 lb (87.1 kg)     Height 11/06/21 1545 5\' 6"  (1.676 m)     Head Circumference --      Peak Flow --      Pain Score 11/06/21 1544 3     Pain Loc --      Pain  Edu? --      Excl. in GC? --    No data found.  Updated Vital Signs BP 127/76 (BP Location: Right Arm)    Pulse 90    Temp 98.7 F (37.1 C) (Oral)    Resp 20    Ht 5\' 6"  (1.676 m)    Wt 192 lb (87.1 kg)    LMP 10/30/2021 (Exact Date)    SpO2 100%    BMI 30.99 kg/m     Physical Exam Vitals and nursing note reviewed.  Constitutional:      General: She is not in acute distress.    Appearance: Normal appearance. She is obese. She is not ill-appearing.  HENT:     Head: Normocephalic.     Mouth/Throat:     Mouth: Mucous membranes are moist.     Pharynx: Oropharynx is clear.  Eyes:     Extraocular Movements: Extraocular movements intact.     Conjunctiva/sclera: Conjunctivae normal.     Pupils: Pupils are equal, round, and reactive to light.  Cardiovascular:     Rate and Rhythm: Normal rate and regular rhythm.     Pulses: Normal pulses.     Heart sounds: Normal heart sounds.  Pulmonary:     Effort: Pulmonary effort is normal.     Breath sounds: Normal breath sounds.  Genitourinary:    Comments: Deferred, pictures taken of external right labia reveal grouped erythematous vesicular lesions. Musculoskeletal:     Cervical back: Normal  range of motion and neck supple.  Skin:    General: Skin is warm and dry.  Neurological:     General: No focal deficit present.     Mental Status: She is alert and oriented to person, place, and time.     UC Treatments / Results  Labs (all labs ordered are listed, but only abnormal results are displayed) Labs Reviewed - No data to display  EKG   Radiology No results found.  Procedures Procedures (including critical care time)  Medications Ordered in UC Medications - No data to display  Initial Impression / Assessment and Plan / UC Course  I have reviewed the triage vital signs and the nursing notes.  Pertinent labs & imaging results that were available during my care of the patient were reviewed by me and considered in my medical decision making (see chart for details).     MDM: 1.  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 Strongly encourage patient to follow-up with PCP or OB/GYN to have HSV-2 (herpes culture taken of this lesion to confirm either way).  Advised patient to take medication as directed with food to completion.  Encourage patient increase daily water intake while taking these medications. Final Clinical Impressions(s) / UC Diagnoses   Final diagnoses:  Vaginal lesion     Discharge Instructions      Strongly encourage patient to follow-up with PCP or OB/GYN to have HSV-2 (herpes culture taken of this lesion to confirm either way).  Advised patient to take medication as directed with food to completion.  Encourage patient increase daily water intake while taking these medications.     ED Prescriptions     Medication Sig Dispense Auth. Provider   valACYclovir (VALTREX) 1000 MG tablet Take 2 tabs p.o. daily x 5 days 14 tablet 14/08/2021, FNP      PDMP not reviewed this encounter.   Trevor Iha, FNP 11/06/21 [phone]

## 2022-01-03 ENCOUNTER — Other Ambulatory Visit: Payer: Self-pay

## 2022-01-03 ENCOUNTER — Ambulatory Visit (INDEPENDENT_AMBULATORY_CARE_PROVIDER_SITE_OTHER): Payer: Medicaid Other

## 2022-01-03 VITALS — BP 118/70 | HR 84 | Ht 66.0 in | Wt 195.0 lb

## 2022-01-03 DIAGNOSIS — N912 Amenorrhea, unspecified: Secondary | ICD-10-CM

## 2022-01-03 DIAGNOSIS — Z3201 Encounter for pregnancy test, result positive: Secondary | ICD-10-CM | POA: Diagnosis not present

## 2022-01-03 LAB — POCT URINE PREGNANCY: Preg Test, Ur: POSITIVE — AB

## 2022-01-03 NOTE — Progress Notes (Signed)
Patient was assessed and managed by nursing staff during this encounter. I have reviewed the chart and agree with the documentation and plan. I have also made any necessary editorial changes.  Mora Bellman, MD 01/03/2022 3:08 PM

## 2022-01-03 NOTE — Progress Notes (Signed)
Rebecca Bean presents today for UPT. She has no unusual complaints. LMP: 10/30/2021    OBJECTIVE: Appears well, in no apparent distress.  OB History     Gravida  2   Para  1   Term  1   Preterm  0   AB  0   Living  1      SAB  0   IAB  0   Ectopic  0   Multiple      Live Births  1          Home UPT Result:POSITIVE In-Office UPT result:POSITIVE  I have reviewed the patient's medical, obstetrical, social, and family histories, and medications.   ASSESSMENT: Positive pregnancy test LMP  10/30/2021 EDD  08/06/2022 GA     [redacted]w[redacted]d  PLAN Prenatal care to be completed at: [facility]

## 2022-01-20 ENCOUNTER — Ambulatory Visit: Payer: Medicaid Other | Admitting: *Deleted

## 2022-01-20 DIAGNOSIS — Z3201 Encounter for pregnancy test, result positive: Secondary | ICD-10-CM

## 2022-01-20 NOTE — Progress Notes (Unsigned)
New OB Intake  I connected with  Rebecca Bean on 01/20/22 at  2:00 PM EST by {Contact:24193} Video Visit and verified that I am speaking with the correct person using two identifiers. Nurse is located at [facility] and pt is located at ***.  I discussed the limitations, risks, security and privacy concerns of performing an evaluation and management service by telephone and the availability of in person appointments. I also discussed with the patient that there may be a patient responsible charge related to this service. The patient expressed understanding and agreed to proceed.  I explained I am completing New OB Intake today. We discussed her EDD of *** that is based on LMP of ***. Pt is G***/P***. I reviewed her allergies, medications, Medical/Surgical/OB history, and appropriate screenings. I informed her of [facility] services. Based on history, this is a/an  pregnancy {Complicated/Uncomplicated Q000111Q .   Patient Active Problem List   Diagnosis Date Noted   IUD (intrauterine device) in place 10/15/2020   Alpha thalassemia silent carrier 03/23/2020   History of C-section 02/10/2020    Concerns addressed today  Delivery Plans:  Plans to deliver at [facility].   Waterbirth candidate?   MyChart/Babyscripts MyChart access verified. I explained pt will have some visits in office and some virtually. Babyscripts instructions given and order placed. Patient verifies receipt of registration text/e-mail. Account successfully created and app downloaded.  Blood Pressure Cuff  {blood pressure cuff:24241} Explained after first prenatal appt pt will check weekly and document in Babyscripts.  Weight scale: Patient does / does not  have weight scale. Weight scale ordered for patient to pick up from [employer].   Anatomy US Explained first scheduled [place] will be around 19 weeks. Anatomy US scheduled for *** at ***. Pt notified to arrive at ***. Scheduled AFP lab only appointment if  CenteringPregnancy pt for same day as anatomy US.   Labs Discussed Johnsie Cancel genetic screening with patient. Would like both Panorama and Horizon drawn at new OB visit.Also if interested in genetic testing, tell patient she will need AFP 15-21 weeks to complete genetic testing .Routine prenatal labs needed.  Covid Vaccine Patient {HAS/HAS NOT:20194} covid vaccine.   Is patient a CenteringPregnancy candidate? {Accepted:19197::"Accepted","Declined","Not a candidate","***"}   Is patient a Mom+Baby Combined Care candidate? {Accepted:19197::"Accepted","Declined","Not a candidate","***"}     Informed patient of [facility] website  and placed link in her AVS.   Social Determinants of Health Food Insecurity: {food:25541} [facility] Referral: Patient {ACTION; IS/IS GI:087931 interested in referral to [facility].  Transportation: {transportation:25542} Childcare: Discussed no children allowed at ultrasound appointments. Offered childcare services; {childcare:25540}  Send link to Pregnancy Navigators   Placed OB Box on problem list and updated  First visit review I reviewed new OB appt with pt. I explained she will have a pelvic exam, ob bloodwork with genetic screening, and PAP smear. Explained pt will be seen by *** at first visit; encounter routed to appropriate provider. Explained that patient will be seen by pregnancy navigator following visit with provider. [facility] information placed in AVS.   Lorin Mercy, [place] 01/20/2022  2:01 PM

## 2022-01-27 ENCOUNTER — Ambulatory Visit (INDEPENDENT_AMBULATORY_CARE_PROVIDER_SITE_OTHER): Payer: Medicaid Other | Admitting: Obstetrics and Gynecology

## 2022-01-27 ENCOUNTER — Other Ambulatory Visit: Payer: Self-pay

## 2022-01-27 ENCOUNTER — Other Ambulatory Visit (HOSPITAL_COMMUNITY)
Admission: RE | Admit: 2022-01-27 | Discharge: 2022-01-27 | Disposition: A | Discharge status: Home / Self Care | Payer: Medicaid Other | Source: Ambulatory Visit | Attending: Obstetrics and Gynecology | Admitting: Obstetrics and Gynecology

## 2022-01-27 ENCOUNTER — Encounter: Payer: Self-pay | Admitting: Obstetrics and Gynecology

## 2022-01-27 VITALS — BP 125/76 | HR 101 | Wt 193.0 lb

## 2022-01-27 DIAGNOSIS — Z3480 Encounter for supervision of other normal pregnancy, unspecified trimester: Secondary | ICD-10-CM

## 2022-01-27 DIAGNOSIS — Z3A12 12 weeks gestation of pregnancy: Secondary | ICD-10-CM

## 2022-01-27 DIAGNOSIS — Z98891 History of uterine scar from previous surgery: Secondary | ICD-10-CM

## 2022-01-27 DIAGNOSIS — D563 Thalassemia minor: Secondary | ICD-10-CM

## 2022-01-27 NOTE — Addendum Note (Signed)
Addended by: Dalphine Handing on: 01/27/2022 03:43 PM ? ? Modules accepted: Orders ? ?

## 2022-01-27 NOTE — Progress Notes (Signed)
NOB in office. She has not concerns at this time.  ?

## 2022-01-27 NOTE — Progress Notes (Signed)
? ?INITIAL PRENATAL VISIT NOTE ? ?Subjective:  ?Rebecca Bean is a 21 y.o. G2P1001 at [redacted]w[redacted]d by approximate LMP of 10/24/22 being seen today for her initial prenatal visit. She has an obstetric history significant for c/s x 1. She has a medical history significant for heart murmur. ? ?Patient reports no complaints.  Contractions: Not present. Vag. Bleeding: None.  Movement: Absent. Denies leaking of fluid.  ?Of note pt had IUD placed, but was removed after 4 months due to spotting. ? ?Past Medical History:  ?Diagnosis Date  ? Heart murmur   ? ? ?Past Surgical History:  ?Procedure Laterality Date  ? CESAREAN SECTION N/A 10/15/2020  ? Procedure: CESAREAN SECTION;  Surgeon: Tereso Newcomer, MD;  Location: [facility];  Service: Obstetrics;  Laterality: N/A;  ? NO PAST SURGERIES    ? ? ?OB History  ?Gravida Para Term Preterm AB Living  ?2 1 1  0 0 1  ?SAB IAB Ectopic Multiple Live Births  ?0 0 0   1  ?  ?# Outcome Date GA Lbr Len/2nd Weight Sex Delivery Anes PTL Lv  ?2 Current           ?1 Term 10/15/20 [redacted]w[redacted]d    CS-LTranv   LIV  ? ? ?Social History  ? ?Socioeconomic History  ? Marital status: Significant Other  ?  Spouse name: Not on file  ? Number of children: Not on file  ? Years of education: Not on file  ? Highest education level: Not on file  ?Occupational History  ? Not on file  ?Tobacco Use  ? Smoking status: Former  ? Smokeless tobacco: Never  ?Vaping Use  ? Vaping Use: Never used  ?Substance and Sexual Activity  ? Alcohol use: No  ? Drug use: No  ? Sexual activity: Yes  ?  Partners: Male  ?Other Topics Concern  ? Not on file  ?Social History Narrative  ? ** Merged History Encounter **  ?    ? ?Social Determinants of Health  ? ?Financial Resource Strain: Not on file  ?Food Insecurity: Not on file  ?Transportation Needs: Not on file  ?Physical Activity: Not on file  ?Stress: Not on file  ?Social Connections: Not on file  ? ? ?Family History  ?Problem Relation Age of Onset  ? Healthy Mother   ? Healthy Father   ?  Hypertension Maternal Grandmother   ? ? ? ?Current Outpatient Medications:  ?  ibuprofen (ADVIL) 800 MG tablet, Take 1 tablet (800 mg total) by mouth every 6 (six) hours. (Patient not taking: Reported on 04/28/2021), Disp: 30 tablet, Rfl: 0 ?  valACYclovir (VALTREX) 1000 MG tablet, Take 2 tabs p.o. daily x 5 days (Patient not taking: Reported on 01/03/2022), Disp: 14 tablet, Rfl: 0 ?  XULANE 150-35 MCG/24HR transdermal patch, Place 1 patch onto the skin once a week. (Patient not taking: Reported on 01/03/2022), Disp: 3 patch, Rfl: 12 ? ?No Known Allergies ? ?Review of Systems: Negative except for what is mentioned in HPI. ? ?Objective:  ? ?Vitals:  ? 01/27/22 1445  ?BP: 125/76  ?Pulse: (!) 101  ?Weight: 193 lb (87.5 kg)  ? ? ?Fetal Status: Fetal Heart Rate (bpm): 154   Movement: Absent    ? ?Physical Exam: ?BP 125/76   Pulse (!) 101   Wt 193 lb (87.5 kg)   LMP 10/30/2021 (Exact Date)   BMI 31.15 kg/m?  ?CONSTITUTIONAL: Well-developed, well-nourished female in no acute distress.  ?NEUROLOGIC: Alert and oriented to person,  place, and time. Normal reflexes, muscle tone coordination. No cranial nerve deficit noted. ?PSYCHIATRIC: Normal mood and affect. Normal behavior. Normal judgment and thought content. ?SKIN: Skin is warm and dry. No rash noted. Not diaphoretic. No erythema. No pallor. ?HENT:  Normocephalic, atraumatic, External right and left ear normal. Oropharynx is clear and moist ?EYES: Conjunctivae and EOM are normal.  ?NECK: Normal range of motion, supple, no masses ?CARDIOVASCULAR: Normal heart rate noted, regular rhythm ?RESPIRATORY: Effort and breath sounds normal, no problems with respiration noted ?BREASTS: deferred ?ABDOMEN: Soft, nontender, nondistended, gravid. Well healed c/s scar noted ?GU: normal appearing external female genitalia,  scant white discharge in vagina, no lesions noted ?Bimanual: 14 weeks sized uterus, no adnexal tenderness or palpable lesions noted ?MUSCULOSKELETAL: Normal range of  motion. ?EXT:  No edema and no tenderness. 2+ distal pulses. ? ? ?Assessment and Plan:  ?Pregnancy: G2P1001 at [redacted]w[redacted]d by approximate LMP ? ?1. Supervision of other normal pregnancy, antepartum ?Continue routine care ? ?- Culture, OB Urine ?- CBC/D/Plt+RPR+Rh+ABO+RubIgG... ?- Genetic Screening ?- Cervicovaginal ancillary only( [facility]) ?- HgB A1c ?- [place] MFM OB COMP + 14 WK; Future ?- Flu Vaccine QUAD 36+ mos IM (Fluarix, Quad PF) ? ?2. History of C-section ?Pt desires TOLAC as well as IUD placement ? ?3. Alpha thalassemia silent carrier ? ? ?4. [redacted] weeks gestation of pregnancy ? ? ? ?Preterm labor symptoms and general obstetric precautions including but not limited to vaginal bleeding, contractions, leaking of fluid and fetal movement were reviewed in detail with the patient. ? ?Please refer to After Visit Summary for other counseling recommendations.  ? ?Return in about 4 weeks (around 02/24/2022) for ROB, in person. ? ?Warden Fillers ?01/27/2022 ?3:20 PM  ?

## 2022-01-28 LAB — CERVICOVAGINAL ANCILLARY ONLY
Chlamydia: NEGATIVE
Comment: NEGATIVE
Comment: NEGATIVE
Comment: NORMAL
Neisseria Gonorrhea: NEGATIVE
Trichomonas: NEGATIVE

## 2022-01-28 LAB — CBC/D/PLT+RPR+RH+ABO+RUBIGG...
Antibody Screen: NEGATIVE
Basophils Absolute: 0 10*3/uL (ref 0.0–0.2)
Basos: 0 %
EOS (ABSOLUTE): 0.3 10*3/uL (ref 0.0–0.4)
Eos: 3 %
HCV Ab: NONREACTIVE
HIV Screen 4th Generation wRfx: NONREACTIVE
Hematocrit: 33.9 % — ABNORMAL LOW (ref 34.0–46.6)
Hemoglobin: 11.3 g/dL (ref 11.1–15.9)
Hepatitis B Surface Ag: NEGATIVE
Immature Grans (Abs): 0 10*3/uL (ref 0.0–0.1)
Immature Granulocytes: 0 %
Lymphocytes Absolute: 2.6 10*3/uL (ref 0.7–3.1)
Lymphs: 24 %
MCH: 25.2 pg — ABNORMAL LOW (ref 26.6–33.0)
MCHC: 33.3 g/dL (ref 31.5–35.7)
MCV: 76 fL — ABNORMAL LOW (ref 79–97)
Monocytes Absolute: 0.7 10*3/uL (ref 0.1–0.9)
Monocytes: 6 %
Neutrophils Absolute: 7.3 10*3/uL — ABNORMAL HIGH (ref 1.4–7.0)
Neutrophils: 67 %
Platelets: 307 10*3/uL (ref 150–450)
RBC: 4.48 x10E6/uL (ref 3.77–5.28)
RDW: 16.4 % — ABNORMAL HIGH (ref 11.7–15.4)
RPR Ser Ql: NONREACTIVE
Rh Factor: POSITIVE
Rubella Antibodies, IGG: 10.9 index (ref >0.99)
WBC: 11.1 10*3/uL — ABNORMAL HIGH (ref 3.4–10.8)

## 2022-01-28 LAB — HEMOGLOBIN A1C
Est. average glucose Bld gHb Est-mCnc: 103 mg/dL
Hgb A1c MFr Bld: 5.2 % (ref 4.8–5.6)

## 2022-01-28 LAB — HCV INTERPRETATION

## 2022-01-29 LAB — URINE CULTURE, OB REFLEX

## 2022-01-29 LAB — CULTURE, OB URINE

## 2022-02-02 ENCOUNTER — Encounter: Payer: Self-pay | Admitting: Obstetrics and Gynecology

## 2022-02-24 ENCOUNTER — Encounter: Payer: Medicaid Other | Admitting: Obstetrics and Gynecology

## 2022-02-24 ENCOUNTER — Telehealth: Payer: Self-pay

## 2022-02-24 NOTE — Progress Notes (Deleted)
Patient missed in person ROB and we were not able to reach patient to convert into a virtual visit. ?Patient will be rescheduled ?

## 2022-02-24 NOTE — Telephone Encounter (Signed)
Tried calling Pt to change visit to virtual x 2. No answer, phone rang out to a busy signal ?

## 2022-02-26 IMAGING — US US OB LIMITED
1 series · 9 of 9 positions shown · non-contrast
Comparison: none

[Series 1: us ob limited · 9 of 9 slices shown]
[im 1/9]
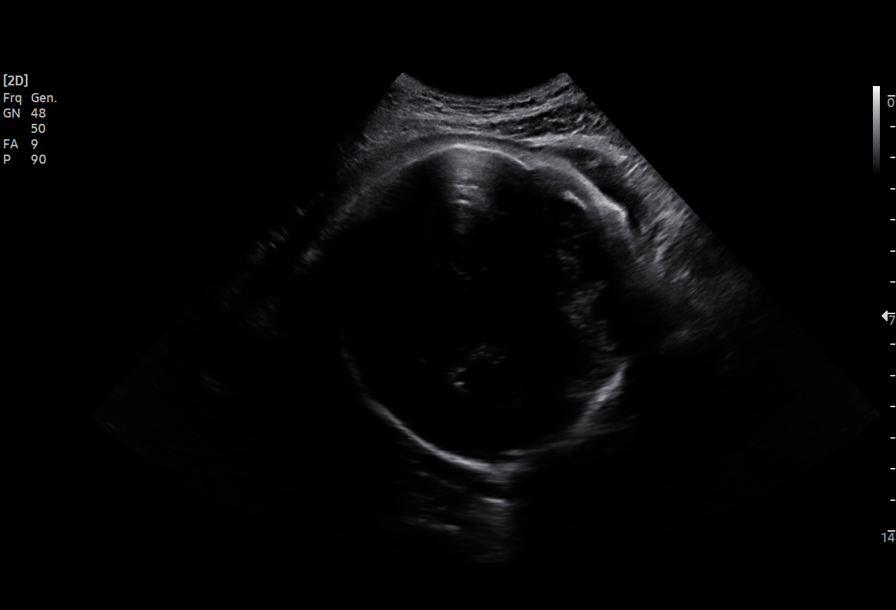
[im 2/9]
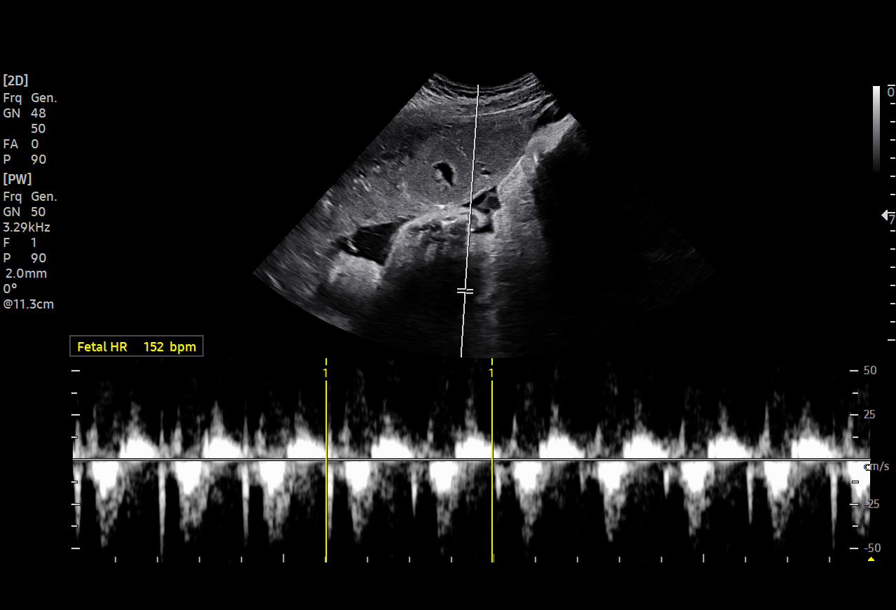
[im 3/9]
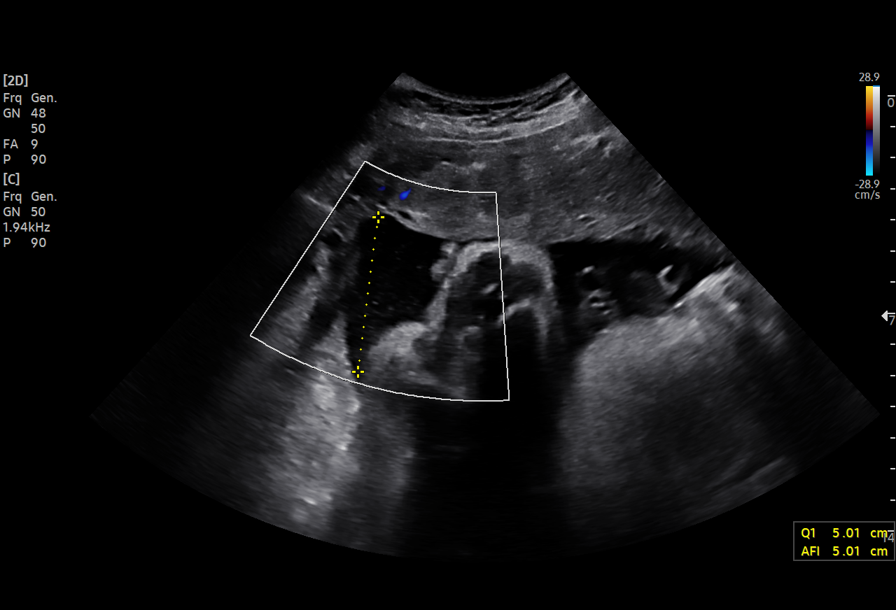
[im 4/9]
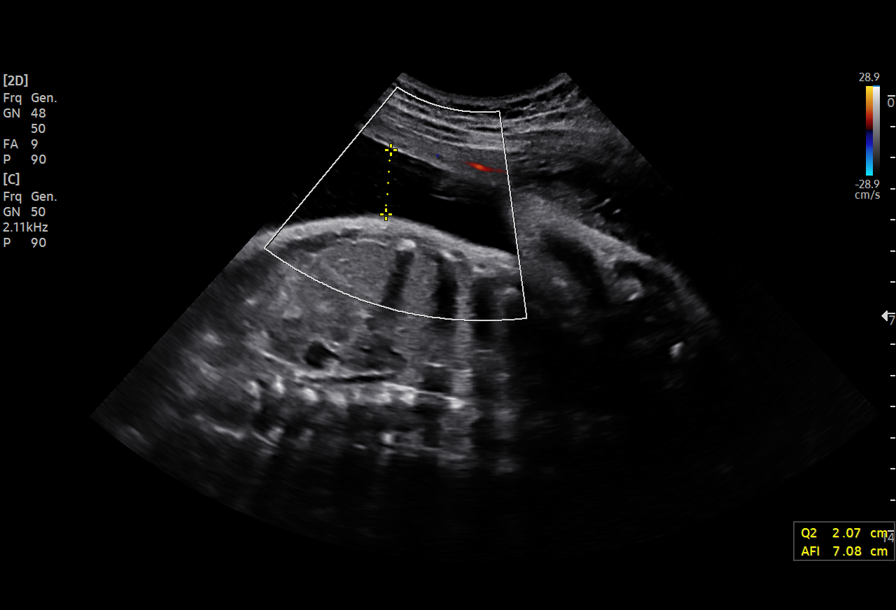
[im 5/9]
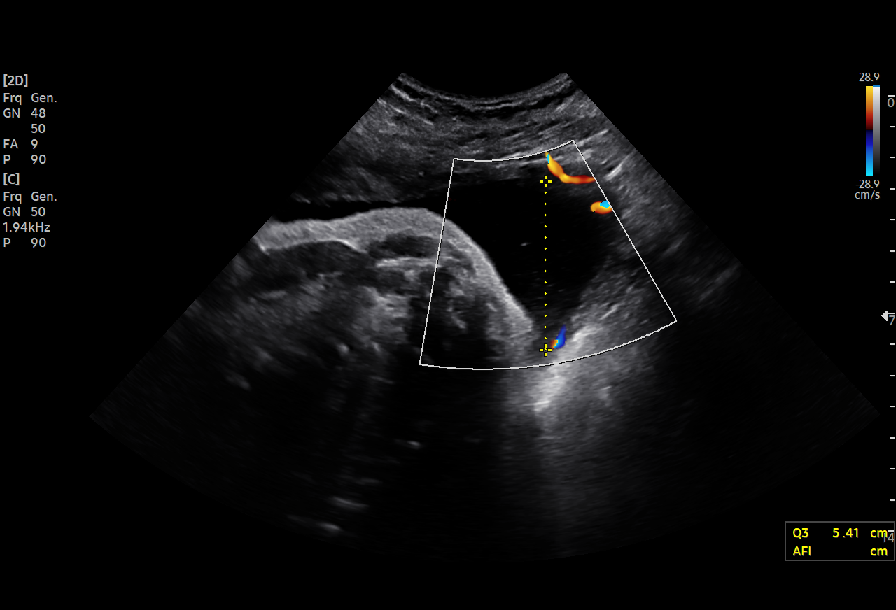
[im 6/9]
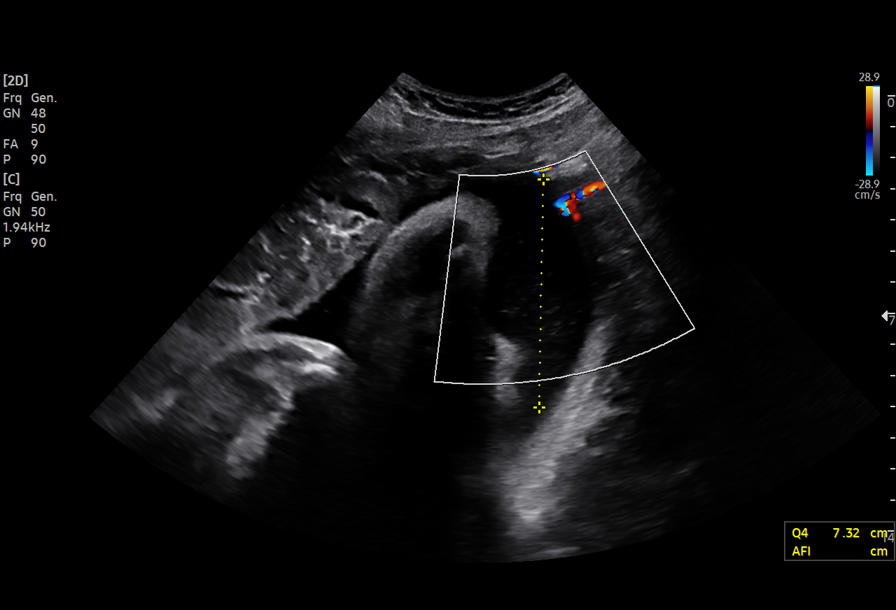
[im 7/9]
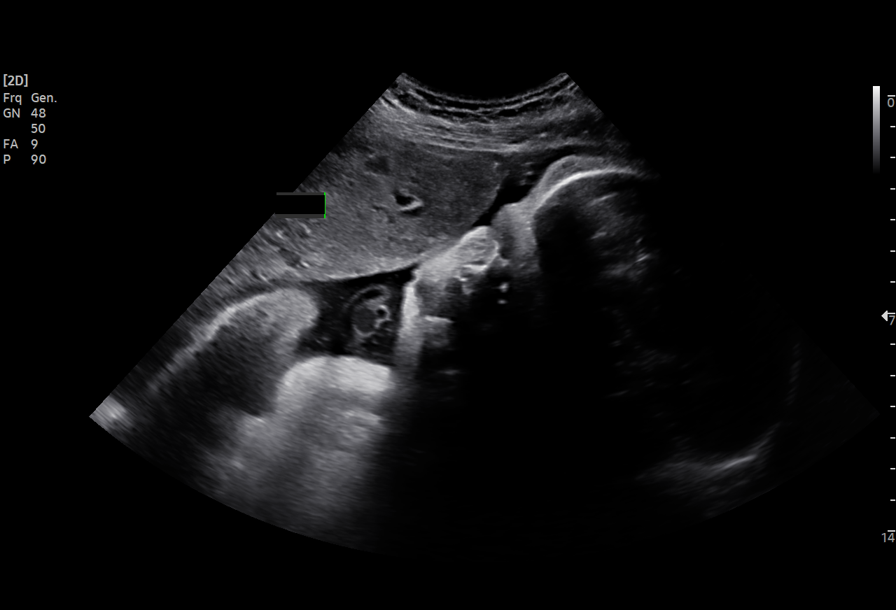
[im 8/9]
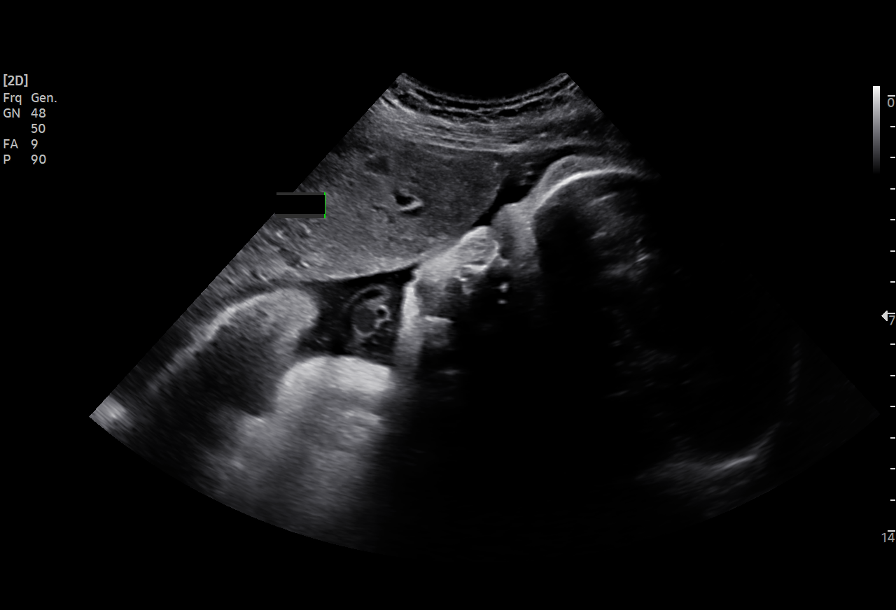
[im 9/9]
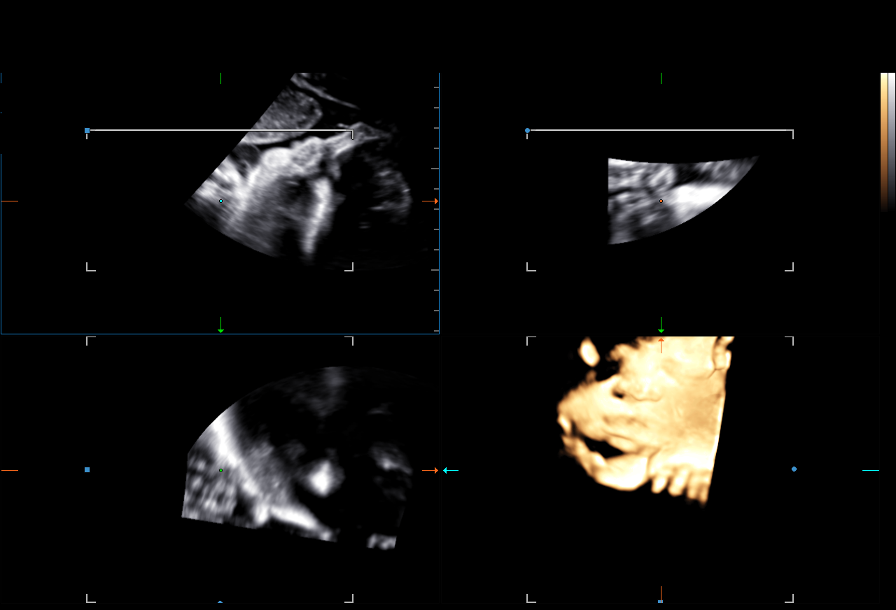

[9 of 9 positions shown; findings below may reference images not displayed]

[REDACTED]care

 1  [HOSPITAL]                         [address].0     FLJUME FRENKI

Indications

 40 weeks gestation of pregnancy
 Postdate pregnancy (40-42 weeks)
Fetal Evaluation

 Num Of Fetuses:          1
 Fetal Heart Rate(bpm):   152
 Cardiac Activity:        Observed

 AFI Sum(cm)     %Tile       Largest Pocket(cm)
 19.81           95

 RUQ(cm)       RLQ(cm)       LUQ(cm)        LLQ(cm)

OB History

 Blood Type:   O+
 Gravidity:    1         Term:   0        Prem:   0        SAB:   0
 TOP:          0       Ectopic:  0        Living: 0
Gestational Age

 LMP:           43w 5d        Date:  12/12/19                 EDD:   09/17/20
 Best:          40w 6d     Det. By:  U/S  (04/23/20)          EDD:   10/07/20
Comments

 A limited ultrasound performed today shows that the fetus is
 in the vertex presentation.
 There was normal amniotic fluid noted.
Impression

 Cephalic presentation with normal AFI
Recommendations

 Delivery by 41 weeks

## 2022-02-28 ENCOUNTER — Telehealth: Payer: Self-pay

## 2022-02-28 NOTE — Telephone Encounter (Signed)
Unable to leave message no vmail set up  ?

## 2022-03-01 ENCOUNTER — Encounter: Payer: Self-pay | Admitting: Obstetrics and Gynecology

## 2022-03-07 ENCOUNTER — Ambulatory Visit: Payer: Medicaid Other | Attending: Obstetrics and Gynecology

## 2022-03-07 ENCOUNTER — Other Ambulatory Visit: Payer: Self-pay | Admitting: *Deleted

## 2022-03-07 ENCOUNTER — Ambulatory Visit: Payer: Medicaid Other | Admitting: *Deleted

## 2022-03-07 VITALS — BP 122/64 | HR 95

## 2022-03-07 DIAGNOSIS — E669 Obesity, unspecified: Secondary | ICD-10-CM | POA: Diagnosis not present

## 2022-03-07 DIAGNOSIS — O99212 Obesity complicating pregnancy, second trimester: Secondary | ICD-10-CM

## 2022-03-07 DIAGNOSIS — Z148 Genetic carrier of other disease: Secondary | ICD-10-CM | POA: Diagnosis not present

## 2022-03-07 DIAGNOSIS — Z3480 Encounter for supervision of other normal pregnancy, unspecified trimester: Secondary | ICD-10-CM

## 2022-03-07 DIAGNOSIS — Z363 Encounter for antenatal screening for malformations: Secondary | ICD-10-CM | POA: Insufficient documentation

## 2022-03-07 DIAGNOSIS — D563 Thalassemia minor: Secondary | ICD-10-CM | POA: Insufficient documentation

## 2022-03-07 DIAGNOSIS — O34219 Maternal care for unspecified type scar from previous cesarean delivery: Secondary | ICD-10-CM | POA: Diagnosis not present

## 2022-03-07 DIAGNOSIS — Z3A19 19 weeks gestation of pregnancy: Secondary | ICD-10-CM | POA: Diagnosis not present

## 2022-03-07 DIAGNOSIS — Z98891 History of uterine scar from previous surgery: Secondary | ICD-10-CM

## 2022-03-07 DIAGNOSIS — Z362 Encounter for other antenatal screening follow-up: Secondary | ICD-10-CM

## 2022-03-25 ENCOUNTER — Ambulatory Visit (INDEPENDENT_AMBULATORY_CARE_PROVIDER_SITE_OTHER): Payer: Medicaid Other | Admitting: Obstetrics

## 2022-03-25 VITALS — BP 109/69 | HR 76 | Wt 194.7 lb

## 2022-03-25 DIAGNOSIS — M549 Dorsalgia, unspecified: Secondary | ICD-10-CM

## 2022-03-25 MED ORDER — COMFORT FIT MATERNITY SUPP SM MISC: 1.0000 "application " | Freq: Every day | 0 refills | Status: DC

## 2022-03-25 NOTE — Progress Notes (Signed)
Pt denies any questions or concerns at this time. ?

## 2022-03-25 NOTE — Progress Notes (Signed)
Subjective:  ?Rebecca Bean is a 21 y.o. G2P1001 at [redacted]w[redacted]d being seen today for ongoing prenatal care.  She is currently monitored for the following issues for this low-risk pregnancy and has History of C-section; Alpha thalassemia silent carrier; IUD (intrauterine device) in place; and Supervision of other normal pregnancy, antepartum on their problem list. ? ?Patient reports backache and nausea.  Contractions: Irritability. Vag. Bleeding: None.  Movement: Present. Denies leaking of fluid.  ? ?The following portions of the patient's history were reviewed and updated as appropriate: allergies, current medications, past family history, past medical history, past social history, past surgical history and problem list. Problem list updated. ? ?Objective:  ? ?Vitals:  ? 03/25/22 1017  ?BP: 109/69  ?Pulse: 76  ?Weight: 194 lb 11.2 oz (88.3 kg)  ? ? ?Fetal Status: Fetal Heart Rate (bpm): 152   Movement: Present    ? ?General:  Alert, oriented and cooperative. Patient is in no acute distress.  ?Skin: Skin is warm and dry. No rash noted.   ?Cardiovascular: Normal heart rate noted  ?Respiratory: Normal respiratory effort, no problems with respiration noted  ?Abdomen: Soft, gravid, appropriate for gestational age. Pain/Pressure: Absent     ?Pelvic:  Cervical exam deferred        ?Extremities: Normal range of motion.  Edema: None  ?Mental Status: Normal mood and affect. Normal behavior. Normal judgment and thought content.  ? ?Urinalysis:     ? ?Assessment and Plan:  ?Pregnancy: G2P1001 at [redacted]w[redacted]d ? ?1. Backache symptom ?Rx: ?- Elastic Bandages & Supports (COMFORT FIT MATERNITY SUPP SM) MISC; 1 application. by Does not apply route daily.  Dispense: 1 each; Refill: 0 ? ?Preterm labor symptoms and general obstetric precautions including but not limited to vaginal bleeding, contractions, leaking of fluid and fetal movement were reviewed in detail with the patient. ?Please refer to After Visit Summary for other counseling  recommendations.  ? ?Return in about 4 weeks (around 04/22/2022) for ROB. ? ? ?Brock Bad, MD  ?03/25/22  ?

## 2022-04-05 ENCOUNTER — Ambulatory Visit: Payer: Medicaid Other | Attending: Obstetrics

## 2022-04-05 ENCOUNTER — Other Ambulatory Visit: Payer: Self-pay | Admitting: *Deleted

## 2022-04-05 ENCOUNTER — Ambulatory Visit: Payer: Medicaid Other | Admitting: *Deleted

## 2022-04-05 VITALS — BP 111/52 | HR 79

## 2022-04-05 DIAGNOSIS — D573 Sickle-cell trait: Secondary | ICD-10-CM | POA: Diagnosis not present

## 2022-04-05 DIAGNOSIS — O99212 Obesity complicating pregnancy, second trimester: Secondary | ICD-10-CM | POA: Diagnosis not present

## 2022-04-05 DIAGNOSIS — Z3480 Encounter for supervision of other normal pregnancy, unspecified trimester: Secondary | ICD-10-CM | POA: Insufficient documentation

## 2022-04-05 DIAGNOSIS — D563 Thalassemia minor: Secondary | ICD-10-CM | POA: Diagnosis not present

## 2022-04-05 DIAGNOSIS — Z148 Genetic carrier of other disease: Secondary | ICD-10-CM | POA: Diagnosis not present

## 2022-04-05 DIAGNOSIS — Z3689 Encounter for other specified antenatal screening: Secondary | ICD-10-CM

## 2022-04-05 DIAGNOSIS — O99012 Anemia complicating pregnancy, second trimester: Secondary | ICD-10-CM

## 2022-04-05 DIAGNOSIS — Z3A23 23 weeks gestation of pregnancy: Secondary | ICD-10-CM | POA: Diagnosis not present

## 2022-04-05 DIAGNOSIS — O34219 Maternal care for unspecified type scar from previous cesarean delivery: Secondary | ICD-10-CM | POA: Insufficient documentation

## 2022-04-05 DIAGNOSIS — E669 Obesity, unspecified: Secondary | ICD-10-CM

## 2022-04-05 DIAGNOSIS — Z362 Encounter for other antenatal screening follow-up: Secondary | ICD-10-CM

## 2022-04-22 ENCOUNTER — Ambulatory Visit (INDEPENDENT_AMBULATORY_CARE_PROVIDER_SITE_OTHER): Payer: Medicaid Other | Admitting: Obstetrics and Gynecology

## 2022-04-22 ENCOUNTER — Encounter: Payer: Self-pay | Admitting: Obstetrics and Gynecology

## 2022-04-22 VITALS — BP 114/69 | HR 88 | Wt 194.6 lb

## 2022-04-22 DIAGNOSIS — D563 Thalassemia minor: Secondary | ICD-10-CM

## 2022-04-22 DIAGNOSIS — Z98891 History of uterine scar from previous surgery: Secondary | ICD-10-CM

## 2022-04-22 DIAGNOSIS — Z3480 Encounter for supervision of other normal pregnancy, unspecified trimester: Secondary | ICD-10-CM

## 2022-04-22 NOTE — Progress Notes (Signed)
Subjective:  Rebecca Bean is a 21 y.o. G2P1001 at [redacted]w[redacted]d being seen today for ongoing prenatal care.  She is currently monitored for the following issues for this low-risk pregnancy and has History of C-section; Alpha thalassemia silent carrier; and Supervision of other normal pregnancy, antepartum on their problem list.  Patient reports no complaints.  Contractions: Not present. Vag. Bleeding: None.  Movement: Present. Denies leaking of fluid.   The following portions of the patient's history were reviewed and updated as appropriate: allergies, current medications, past family history, past medical history, past social history, past surgical history and problem list. Problem list updated.  Objective:   Vitals:   04/22/22 1000  BP: 114/69  Pulse: 88  Weight: 194 lb 9.6 oz (88.3 kg)    Fetal Status: Fetal Heart Rate (bpm): 150   Movement: Present     General:  Alert, oriented and cooperative. Patient is in no acute distress.  Skin: Skin is warm and dry. No rash noted.   Cardiovascular: Normal heart rate noted  Respiratory: Normal respiratory effort, no problems with respiration noted  Abdomen: Soft, gravid, appropriate for gestational age. Pain/Pressure: Absent     Pelvic:  Cervical exam deferred        Extremities: Normal range of motion.  Edema: None  Mental Status: Normal mood and affect. Normal behavior. Normal judgment and thought content.   Urinalysis:      Assessment and Plan:  Pregnancy: G2P1001 at [redacted]w[redacted]d  1. Supervision of other normal pregnancy, antepartum Stable Glucola next visit  2. History of C-section TOLAC consent today  3. Alpha thalassemia silent carrier Stable  Preterm labor symptoms and general obstetric precautions including but not limited to vaginal bleeding, contractions, leaking of fluid and fetal movement were reviewed in detail with the patient. Please refer to After Visit Summary for other counseling recommendations.  Return in about 3 weeks  (around 05/13/2022) for OB visit, face to face, any provider, fasting for Glucola.   Hermina Staggers, MD

## 2022-04-22 NOTE — Progress Notes (Signed)
Pt presents for ROB visit with no concerns at this time.  

## 2022-04-22 NOTE — Patient Instructions (Signed)

## 2022-05-11 ENCOUNTER — Encounter (HOSPITAL_COMMUNITY): Payer: Self-pay | Admitting: Obstetrics and Gynecology

## 2022-05-11 ENCOUNTER — Other Ambulatory Visit: Payer: Self-pay

## 2022-05-11 ENCOUNTER — Inpatient Hospital Stay (HOSPITAL_COMMUNITY)
Admission: AD | Admit: 2022-05-11 | Discharge: 2022-05-11 | Disposition: A | Discharge status: Home / Self Care | Payer: Medicaid Other | Attending: Obstetrics and Gynecology | Admitting: Obstetrics and Gynecology

## 2022-05-11 DIAGNOSIS — Z3A28 28 weeks gestation of pregnancy: Secondary | ICD-10-CM | POA: Diagnosis not present

## 2022-05-11 DIAGNOSIS — O99891 Other specified diseases and conditions complicating pregnancy: Secondary | ICD-10-CM | POA: Diagnosis not present

## 2022-05-11 DIAGNOSIS — M549 Dorsalgia, unspecified: Secondary | ICD-10-CM | POA: Diagnosis not present

## 2022-05-11 DIAGNOSIS — R102 Pelvic and perineal pain unspecified side: Secondary | ICD-10-CM

## 2022-05-11 DIAGNOSIS — R109 Unspecified abdominal pain: Secondary | ICD-10-CM | POA: Insufficient documentation

## 2022-05-11 DIAGNOSIS — O26893 Other specified pregnancy related conditions, third trimester: Secondary | ICD-10-CM

## 2022-05-11 DIAGNOSIS — O26899 Other specified pregnancy related conditions, unspecified trimester: Secondary | ICD-10-CM

## 2022-05-11 LAB — URINALYSIS, ROUTINE W REFLEX MICROSCOPIC
Bilirubin Urine: NEGATIVE
Glucose, UA: NEGATIVE mg/dL
Hgb urine dipstick: NEGATIVE
Ketones, ur: NEGATIVE mg/dL
Leukocytes,Ua: NEGATIVE
Nitrite: NEGATIVE
Protein, ur: 30 mg/dL — AB
Specific Gravity, Urine: 1.029 (ref 1.005–1.030)
pH: 6 (ref 5.0–8.0)

## 2022-05-11 MED ORDER — COMFORT FIT MATERNITY SUPP SM MISC: 1.0000 "application " | Freq: Every day | 0 refills | Status: DC

## 2022-05-11 NOTE — Discharge Instructions (Signed)
   PREGNANCY SUPPORT BELT: You are not alone, Seventy-five percent of women have some sort of abdominal or back pain at some point in their pregnancy. Your baby is growing at a fast pace, which means that your whole body is rapidly trying to adjust to the changes. As your uterus grows, your back may start feeling a bit under stress and this can result in back or abdominal pain that can go from mild, and therefore bearable, to severe pains that will not allow you to sit or lay down comfortably, When it comes to dealing with pregnancy-related pains and cramps, some pregnant women usually prefer natural remedies, which the market is filled with nowadays. For example, wearing a pregnancy support belt can help ease and lessen your discomfort and pain. WHAT ARE THE BENEFITS OF WEARING A PREGNANCY SUPPORT BELT? A pregnancy support belt provides support to the lower portion of the belly taking some of the weight of the growing uterus and distributing to the other parts of your body. It is designed make you comfortable and gives you extra support. Over the years, the pregnancy apparel market has been studying the needs and wants of pregnant women and they have come up with the most comfortable pregnancy support belts that woman could ever ask for. In fact, you will no longer have to wear a stretched-out or bulky pregnancy belt that is visible underneath your clothes and makes you feel even more uncomfortable. Nowadays, a pregnancy support belt is made of comfortable and stretchy materials that will not irritate your skin but will actually make you feel at ease and you will not even notice you are wearing it. They are easy to put on and adjust during the day and can be worn at night for additional support.  BENEFITS: Relives Back pain Relieves Abdominal Muscle and Leg Pain Stabilizes the Pelvic Ring Offers a Cushioned Abdominal Lift Pad Relieves pressure on the Sciatic Nerve Within Minutes   Locations that Carry  Maternity/Pregnancy Support Belts  These locations will file your insurance, including [place] Medicaid:  [facility] [address] [place], [place] [address] [phone]  [facility] [address]  [place], [place] [address] [phone]  [facility] [address] [place], [place] [address] [phone]  [facility] [address] [place], [place] [address] [phone]  If you have any problems getting the belt, let your [facility] office know.  

## 2022-05-11 NOTE — MAU Provider Note (Addendum)
History     CSN: [id number]  Arrival date and time: 05/11/22 1115   Event Date/Time   First Provider Initiated Contact with Patient 05/11/22 1242      Chief Complaint  Patient presents with   Abdominal Pain   Back Pain   Abdominal Pain  Back Pain Associated symptoms include abdominal pain.    Patient presents with 1 day history of back and abdominal pain. Pain began yesterday morning, when patient woke up and lasted until patient went to sleep that night, and restarted when patient awoke today. Pain rated 6/10 at onset, but get's better during the day. Patient notes that pain starts in her back, then extends to the stomach area, deep inside her abdomen. Patient also notes recent history of bending at waist and lifting heavy boxes on the job at work. Patient has no other associated symptoms, and denies any nausea, vomiting, constipation, or diarrhea. Reports regular frequency and from of her bowel movements, most recent one this morning. Patient report's that she's otherwise been in her normal state of health. Patient denies any vaginal bleeding, leakage of fluid, contractions, but has been feeling fetal movements.   OB History     Gravida  2   Para  1   Term  1   Preterm  0   AB  0   Living  1      SAB  0   IAB  0   Ectopic  0   Multiple      Live Births  1           Past Medical History:  Diagnosis Date   Heart murmur     Past Surgical History:  Procedure Laterality Date   CESAREAN SECTION N/A 10/15/2020   Procedure: CESAREAN SECTION;  Surgeon: Tereso Newcomer, MD;  Location: [facility];  Service: Obstetrics;  Laterality: N/A;   NO PAST SURGERIES     WISDOM TOOTH EXTRACTION      Family History  Problem Relation Age of Onset   Healthy Mother    Healthy Father    Diabetes Maternal Grandmother    Hypertension Maternal Grandmother     Social History   Tobacco Use   Smoking status: Never   Smokeless tobacco: Never  Vaping Use   Vaping  Use: Never used  Substance Use Topics   Alcohol use: Not Currently   Drug use: No    Allergies: No Known Allergies  Medications Prior to Admission  Medication Sig Dispense Refill Last Dose   Elastic Bandages & Supports (COMFORT FIT MATERNITY SUPP SM) MISC 1 application. by Does not apply route daily. (Patient not taking: Reported on 04/05/2022) 1 each 0    prenatal vitamin w/FE, FA (NATACHEW) 29-1 MG CHEW chewable tablet Chew 1 tablet by mouth daily at 12 noon.   More than a month    Review of Systems  Gastrointestinal:  Positive for abdominal pain.  Musculoskeletal:  Positive for back pain.   Physical Exam   Blood pressure (!) 123/58, pulse 90, temperature 98.6 F (37 C), temperature source Oral, resp. rate 16, height 5\' 6"  (1.676 m), weight 89.9 kg, last menstrual period 10/24/2021, SpO2 99 %, not currently breastfeeding.  FHR: Baseline 145, moderate variability, accelerations present, no decelerations present.  Physical Exam Constitutional:      Appearance: She is well-developed.  Pulmonary:     Effort: Pulmonary effort is normal.  Abdominal:     Palpations: Abdomen is soft.  Tenderness: There is no abdominal tenderness. There is no guarding or rebound.  Musculoskeletal:     Lumbar back: Tenderness (minimal TTP) present. No swelling, edema, signs of trauma or spasms.  Neurological:     Mental Status: She is alert.    MAU Course  Procedures   Assessment and Plan   Back pain affecting pregnancy in third trimester Patient pain appears to be musculoskeletal in nature, given work history, and timing of symptoms with rest. Patient in normal state of health, with no signs/symptoms of infection, patient had normal UA, making UTI less likely.  - Maternity belt  - Plan: Discharge patient  Abdominal pain during pregnancy in third trimester Patient abdominal pain is associated with her back pain, with no signs or symptoms of infectious etiology, and normal UA, making UTI  less likely. Patient abdominal pain is relieved with rest and not associated with eating, or bowel movements. Patient had cervical exam that was closed, and thick, make preterm labor less likely.   - Plan: Discharge patient  - Continue to monitor  Pain of round ligament affecting pregnancy, antepartum  Patient back/abdominal pain may also be 2/2 round ligament irritation. Pain complaint felt to be MSK in nature.  - Plan: Discharge patient  [redacted] weeks gestation of pregnancy  Patient 28 weeks by LMP.  - Plan: Discharge patient      Bess Kinds 05/11/2022, 1:08 PM   CNM attestation:  I have seen and examined this patient; I agree with above documentation in the residents's note.   Rebecca Bean is a 21 y.o. G2P1001 reporting back and abdominal pain. She has a physical job lifting boxes.  +FM, denies LOF, VB, contractions, vaginal discharge.  PE: BP (!) 115/59   Pulse 80   Temp 98.6 F (37 C) (Oral)   Resp 17   Ht 5\' 6"  (1.676 m)   Wt 198 lb 4.8 oz (89.9 kg)   LMP 10/24/2021 (Approximate)   SpO2 100%   BMI 32.01 kg/m  Gen: calm comfortable, NAD Resp: normal effort, no distress Abd: gravid  ROS, labs, PMH reviewed NST appropriate for gestational age MDM:  Pain is 0/10 with pt resting in bed in MAU.  Likely MSK pain with no s/sx of PTL.  Rx for maternity belt and instructed where to go to file insurance and get a belt.   Plan: D/C home Fetal kick counts reinforced, preterm labor precautions Rx for maternity belt Continue routine follow up in OB clinic Return to MAU as needed for emergencies  14/02/2021, CNM 10:25 AM

## 2022-05-11 NOTE — MAU Note (Signed)
Rebecca Bean is a 21 y.o. at [redacted]w[redacted]d here in MAU reporting: yesterday had sharp LLQ and lower left back pain. States pain went away and then an hour ago it came back. Has not taken any pain meds. No bleeding or LOF. +FM.  Onset of complaint: today  Pain score: 6/10  Vitals:   05/11/22 1129  BP: (!) 123/58  Pulse: 90  Resp: 16  Temp: 98.6 F (37 C)  SpO2: 99%     FHT:149  Lab orders placed from triage: UA

## 2022-05-13 ENCOUNTER — Other Ambulatory Visit: Payer: Medicaid Other

## 2022-05-13 ENCOUNTER — Ambulatory Visit (INDEPENDENT_AMBULATORY_CARE_PROVIDER_SITE_OTHER): Payer: Medicaid Other | Admitting: Obstetrics and Gynecology

## 2022-05-13 ENCOUNTER — Encounter: Payer: Self-pay | Admitting: Obstetrics and Gynecology

## 2022-05-13 ENCOUNTER — Encounter: Payer: Medicaid Other | Admitting: Obstetrics

## 2022-05-13 VITALS — BP 108/58 | HR 76 | Wt 193.5 lb

## 2022-05-13 DIAGNOSIS — Z3A28 28 weeks gestation of pregnancy: Secondary | ICD-10-CM

## 2022-05-13 DIAGNOSIS — Z3483 Encounter for supervision of other normal pregnancy, third trimester: Secondary | ICD-10-CM

## 2022-05-13 DIAGNOSIS — D563 Thalassemia minor: Secondary | ICD-10-CM

## 2022-05-13 DIAGNOSIS — Z98891 History of uterine scar from previous surgery: Secondary | ICD-10-CM

## 2022-05-13 DIAGNOSIS — O219 Vomiting of pregnancy, unspecified: Secondary | ICD-10-CM

## 2022-05-13 DIAGNOSIS — Z3480 Encounter for supervision of other normal pregnancy, unspecified trimester: Secondary | ICD-10-CM

## 2022-05-13 MED ORDER — ONDANSETRON HCL 4 MG PO TABS: 4.0000 mg | ORAL_TABLET | Freq: Three times a day (TID) | ORAL | 0 refills | Status: DC | PRN

## 2022-05-13 NOTE — Progress Notes (Signed)
2 gtt canceled. Pt vomited drink.  CBC, HIV, RPR completed today.  Tdap offered; pt declined.

## 2022-05-14 LAB — CBC
Hematocrit: 28.8 % — ABNORMAL LOW (ref 34.0–46.6)
Hemoglobin: 9.2 g/dL — ABNORMAL LOW (ref 11.1–15.9)
MCH: 25.6 pg — ABNORMAL LOW (ref 26.6–33.0)
MCHC: 31.9 g/dL (ref 31.5–35.7)
MCV: 80 fL (ref 79–97)
Platelets: 305 10*3/uL (ref 150–450)
RBC: 3.6 x10E6/uL — ABNORMAL LOW (ref 3.77–5.28)
RDW: 13.3 % (ref 11.7–15.4)
WBC: 13 10*3/uL — ABNORMAL HIGH (ref 3.4–10.8)

## 2022-05-14 LAB — HIV ANTIBODY (ROUTINE TESTING W REFLEX): HIV Screen 4th Generation wRfx: NONREACTIVE

## 2022-05-14 LAB — RPR: RPR Ser Ql: NONREACTIVE

## 2022-05-19 ENCOUNTER — Other Ambulatory Visit: Payer: Medicaid Other

## 2022-05-19 DIAGNOSIS — Z3A29 29 weeks gestation of pregnancy: Secondary | ICD-10-CM

## 2022-05-19 DIAGNOSIS — Z3480 Encounter for supervision of other normal pregnancy, unspecified trimester: Secondary | ICD-10-CM

## 2022-05-20 LAB — GLUCOSE TOLERANCE, 2 HOURS W/ 1HR
Glucose, 1 hour: 98 mg/dL (ref 70–179)
Glucose, 2 hour: 60 mg/dL — ABNORMAL LOW (ref 70–152)
Glucose, Fasting: 83 mg/dL (ref 70–91)

## 2022-05-30 ENCOUNTER — Encounter: Payer: Medicaid Other | Admitting: Obstetrics & Gynecology

## 2022-06-07 ENCOUNTER — Ambulatory Visit: Payer: Medicaid Other | Attending: Obstetrics and Gynecology

## 2022-06-07 ENCOUNTER — Ambulatory Visit: Payer: Medicaid Other | Admitting: *Deleted

## 2022-06-07 ENCOUNTER — Other Ambulatory Visit: Payer: Self-pay | Admitting: *Deleted

## 2022-06-07 VITALS — BP 115/63 | HR 88

## 2022-06-07 DIAGNOSIS — Z3480 Encounter for supervision of other normal pregnancy, unspecified trimester: Secondary | ICD-10-CM | POA: Insufficient documentation

## 2022-06-07 DIAGNOSIS — O285 Abnormal chromosomal and genetic finding on antenatal screening of mother: Secondary | ICD-10-CM | POA: Diagnosis not present

## 2022-06-07 DIAGNOSIS — Z3689 Encounter for other specified antenatal screening: Secondary | ICD-10-CM | POA: Insufficient documentation

## 2022-06-07 DIAGNOSIS — O34219 Maternal care for unspecified type scar from previous cesarean delivery: Secondary | ICD-10-CM | POA: Diagnosis not present

## 2022-06-07 DIAGNOSIS — Z148 Genetic carrier of other disease: Secondary | ICD-10-CM | POA: Insufficient documentation

## 2022-06-07 DIAGNOSIS — O365999 Maternal care for other known or suspected poor fetal growth, unspecified trimester, other fetus: Secondary | ICD-10-CM | POA: Diagnosis not present

## 2022-06-07 DIAGNOSIS — D563 Thalassemia minor: Secondary | ICD-10-CM | POA: Diagnosis not present

## 2022-06-07 DIAGNOSIS — O99213 Obesity complicating pregnancy, third trimester: Secondary | ICD-10-CM | POA: Diagnosis not present

## 2022-06-07 DIAGNOSIS — O358XX0 Maternal care for other (suspected) fetal abnormality and damage, not applicable or unspecified: Secondary | ICD-10-CM | POA: Diagnosis not present

## 2022-06-07 DIAGNOSIS — O365929 Maternal care for other known or suspected poor fetal growth, second trimester, other fetus: Secondary | ICD-10-CM

## 2022-06-07 DIAGNOSIS — Z3A32 32 weeks gestation of pregnancy: Secondary | ICD-10-CM | POA: Insufficient documentation

## 2022-06-07 DIAGNOSIS — D573 Sickle-cell trait: Secondary | ICD-10-CM | POA: Diagnosis not present

## 2022-06-07 DIAGNOSIS — E669 Obesity, unspecified: Secondary | ICD-10-CM

## 2022-06-16 ENCOUNTER — Encounter: Payer: Self-pay | Admitting: Obstetrics and Gynecology

## 2022-06-16 ENCOUNTER — Ambulatory Visit (INDEPENDENT_AMBULATORY_CARE_PROVIDER_SITE_OTHER): Payer: Medicaid Other | Admitting: Obstetrics and Gynecology

## 2022-06-16 VITALS — BP 113/74 | HR 94 | Wt 197.9 lb

## 2022-06-16 DIAGNOSIS — D563 Thalassemia minor: Secondary | ICD-10-CM

## 2022-06-16 DIAGNOSIS — Z3480 Encounter for supervision of other normal pregnancy, unspecified trimester: Secondary | ICD-10-CM

## 2022-06-16 NOTE — Patient Instructions (Signed)

## 2022-06-16 NOTE — Progress Notes (Signed)
Subjective:  Rebecca Bean is a 21 y.o. G2P1001 at [redacted]w[redacted]d being seen today for ongoing prenatal care.  She is currently monitored for the following issues for this high-risk pregnancy and has History of C-section; Alpha thalassemia silent carrier; and Supervision of other normal pregnancy, antepartum on their problem list.  Patient reports general discomforts of pregnancy, occ N/V.  Contractions: Not present. Vag. Bleeding: None.  Movement: Present. Denies leaking of fluid.   The following portions of the patient's history were reviewed and updated as appropriate: allergies, current medications, past family history, past medical history, past social history, past surgical history and problem list. Problem list updated.  Objective:   Vitals:   06/16/22 1042  BP: 113/74  Pulse: 94  Weight: 197 lb 14.4 oz (89.8 kg)    Fetal Status: Fetal Heart Rate (bpm): 159   Movement: Present     General:  Alert, oriented and cooperative. Patient is in no acute distress.  Skin: Skin is warm and dry. No rash noted.   Cardiovascular: Normal heart rate noted  Respiratory: Normal respiratory effort, no problems with respiration noted  Abdomen: Soft, gravid, appropriate for gestational age. Pain/Pressure: Present     Pelvic:  Cervical exam deferred        Extremities: Normal range of motion.  Edema: None  Mental Status: Normal mood and affect. Normal behavior. Normal judgment and thought content.   Urinalysis:      Assessment and Plan:  Pregnancy: G2P1001 at [redacted]w[redacted]d  1. Supervision of other normal pregnancy, antepartum Stable. Growth scan next month  2. Alpha thalassemia silent carrier Stable  3. H/O c section     Desires TOLAC, consented  Preterm labor symptoms and general obstetric precautions including but not limited to vaginal bleeding, contractions, leaking of fluid and fetal movement were reviewed in detail with the patient. Please refer to After Visit Summary for other counseling  recommendations.  Return in about 2 weeks (around 06/30/2022) for OB visit, face to face, any provider.   Hermina Staggers, MD

## 2022-06-16 NOTE — Progress Notes (Signed)
Pt presents for ROB visit. Pt states she vomited blood yesterday. Denies pain, contractions and any other issues the time of vomiting.

## 2022-06-28 NOTE — Progress Notes (Signed)
   PRENATAL VISIT NOTE  Subjective:  Rebecca Bean is a 21 y.o. G2P1001 at [redacted]w[redacted]d being seen today for ongoing prenatal care.  She is currently monitored for the following issues for this low-risk pregnancy and has History of C-section; Alpha thalassemia silent carrier; and Supervision of other normal pregnancy, antepartum on their problem list.  Patient reports occasional contractions at night.  Contractions: Irritability. Vag. Bleeding: None.  Movement: Present. Denies leaking of fluid.   The following portions of the patient's history were reviewed and updated as appropriate: allergies, current medications, past family history, past medical history, past social history, past surgical history and problem list.   Objective:   Vitals:   07/01/22 1037  BP: 121/70  Pulse: 79  Weight: 207 lb 6.4 oz (94.1 kg)    Fetal Status: Fetal Heart Rate (bpm): 141 Fundal Height: 35 cm Movement: Present     General:  Alert, oriented and cooperative. Patient is in no acute distress.  Skin: Skin is warm and dry. No rash noted.   Cardiovascular: Normal heart rate noted  Respiratory: Normal respiratory effort, no problems with respiration noted  Abdomen: Soft, gravid, appropriate for gestational age.  Pain/Pressure: Present     Pelvic: Cervical exam deferred        Extremities: Normal range of motion.  Edema: None  Mental Status: Normal mood and affect. Normal behavior. Normal judgment and thought content.   Assessment and Plan:  Pregnancy: G2P1001 at [redacted]w[redacted]d 1. History of C-section Desires TOLAC - prior c-section due to NRFHT at 41w. Dilated to 3 cm.   2. Supervision of other normal pregnancy, antepartum Cultures NV Has [place] on 8/17 for prior EFW being low normal at 14%ile  Preterm labor symptoms and general obstetric precautions including but not limited to vaginal bleeding, contractions, leaking of fluid and fetal movement were reviewed in detail with the patient. Please refer to After Visit  Summary for other counseling recommendations.   Return in about 1 week (around 07/08/2022) for OB VISIT, MD or APP.  Future Appointments  Date Time Provider Department Center  07/07/2022  8:30 AM [facility] NURSE [facility]  07/07/2022  8:45 AM [facility]    Milas Hock, MD

## 2022-06-30 ENCOUNTER — Encounter: Payer: Medicaid Other | Admitting: Obstetrics & Gynecology

## 2022-07-01 ENCOUNTER — Encounter: Payer: Self-pay | Admitting: Obstetrics and Gynecology

## 2022-07-01 ENCOUNTER — Ambulatory Visit (INDEPENDENT_AMBULATORY_CARE_PROVIDER_SITE_OTHER): Payer: Medicaid Other | Admitting: Obstetrics and Gynecology

## 2022-07-01 VITALS — BP 121/70 | HR 79 | Wt 207.4 lb

## 2022-07-01 DIAGNOSIS — O99013 Anemia complicating pregnancy, third trimester: Secondary | ICD-10-CM

## 2022-07-01 DIAGNOSIS — Z3480 Encounter for supervision of other normal pregnancy, unspecified trimester: Secondary | ICD-10-CM

## 2022-07-01 DIAGNOSIS — Z98891 History of uterine scar from previous surgery: Secondary | ICD-10-CM

## 2022-07-05 ENCOUNTER — Telehealth: Payer: Self-pay | Admitting: *Deleted

## 2022-07-05 NOTE — Telephone Encounter (Signed)
TC from pt reporting ankles and feet swelling. Denies, HA, blurry vision or RUQ pain. Reports dizziness with onset of swelling. Unable to check BP at home. Advised that while swelling in pregnancy is to be expected dizziness is concerning. Advised to seek care in MAU if she feels her condition is urgent. Otherwise she will plan to come for her appt 07/08/22.

## 2022-07-07 ENCOUNTER — Encounter: Payer: Self-pay | Admitting: *Deleted

## 2022-07-07 ENCOUNTER — Ambulatory Visit: Payer: Medicaid Other | Admitting: *Deleted

## 2022-07-07 ENCOUNTER — Ambulatory Visit: Payer: Medicaid Other | Attending: Obstetrics

## 2022-07-07 VITALS — BP 128/68 | HR 76

## 2022-07-07 DIAGNOSIS — D573 Sickle-cell trait: Secondary | ICD-10-CM

## 2022-07-07 DIAGNOSIS — O99213 Obesity complicating pregnancy, third trimester: Secondary | ICD-10-CM | POA: Diagnosis not present

## 2022-07-07 DIAGNOSIS — E669 Obesity, unspecified: Secondary | ICD-10-CM

## 2022-07-07 DIAGNOSIS — O285 Abnormal chromosomal and genetic finding on antenatal screening of mother: Secondary | ICD-10-CM

## 2022-07-07 DIAGNOSIS — D563 Thalassemia minor: Secondary | ICD-10-CM

## 2022-07-07 DIAGNOSIS — O34219 Maternal care for unspecified type scar from previous cesarean delivery: Secondary | ICD-10-CM

## 2022-07-07 DIAGNOSIS — O365929 Maternal care for other known or suspected poor fetal growth, second trimester, other fetus: Secondary | ICD-10-CM | POA: Diagnosis present

## 2022-07-07 DIAGNOSIS — Z6831 Body mass index (BMI) 31.0-31.9, adult: Secondary | ICD-10-CM | POA: Diagnosis present

## 2022-07-07 DIAGNOSIS — Z3A36 36 weeks gestation of pregnancy: Secondary | ICD-10-CM

## 2022-07-08 ENCOUNTER — Encounter: Payer: Self-pay | Admitting: Obstetrics and Gynecology

## 2022-07-08 ENCOUNTER — Ambulatory Visit (INDEPENDENT_AMBULATORY_CARE_PROVIDER_SITE_OTHER): Payer: Medicaid Other | Admitting: Obstetrics and Gynecology

## 2022-07-08 ENCOUNTER — Other Ambulatory Visit (HOSPITAL_COMMUNITY)
Admission: RE | Admit: 2022-07-08 | Discharge: 2022-07-08 | Disposition: A | Discharge status: Home / Self Care | Payer: Medicaid Other | Source: Ambulatory Visit | Attending: Obstetrics and Gynecology | Admitting: Obstetrics and Gynecology

## 2022-07-08 VITALS — BP 123/75 | HR 92 | Wt 207.9 lb

## 2022-07-08 DIAGNOSIS — Z3480 Encounter for supervision of other normal pregnancy, unspecified trimester: Secondary | ICD-10-CM | POA: Diagnosis present

## 2022-07-08 DIAGNOSIS — Z98891 History of uterine scar from previous surgery: Secondary | ICD-10-CM

## 2022-07-08 DIAGNOSIS — D563 Thalassemia minor: Secondary | ICD-10-CM

## 2022-07-08 DIAGNOSIS — Z3483 Encounter for supervision of other normal pregnancy, third trimester: Secondary | ICD-10-CM

## 2022-07-08 DIAGNOSIS — Z3A36 36 weeks gestation of pregnancy: Secondary | ICD-10-CM

## 2022-07-08 NOTE — Progress Notes (Signed)
Pt presents for ROB, GBS, GC/CT.  No concerns per pt

## 2022-07-08 NOTE — Progress Notes (Signed)
Subjective:  Rebecca Bean is a 21 y.o. G2P1001 at [redacted]w[redacted]d being seen today for ongoing prenatal care.  She is currently monitored for the following issues for this low-risk pregnancy and has History of C-section; Alpha thalassemia silent carrier; and Supervision of other normal pregnancy, antepartum on their problem list.  Patient reports general discomforts of pregnancy.  Contractions: Not present. Vag. Bleeding: None.  Movement: Present. Denies leaking of fluid.   The following portions of the patient's history were reviewed and updated as appropriate: allergies, current medications, past family history, past medical history, past social history, past surgical history and problem list. Problem list updated.  Objective:   Vitals:   07/08/22 0900  BP: 123/75  Pulse: 92  Weight: 207 lb 14.4 oz (94.3 kg)    Fetal Status: Fetal Heart Rate (bpm): 145    Movement: Present     General:  Alert, oriented and cooperative. Patient is in no acute distress.  Skin: Skin is warm and dry. No rash noted.   Cardiovascular: Normal heart rate noted  Respiratory: Normal respiratory effort, no problems with respiration noted  Abdomen: Soft, gravid, appropriate for gestational age. Pain/Pressure: Present     Pelvic:  Cervical exam performed        Extremities: Normal range of motion.  Edema: None  Mental Status: Normal mood and affect. Normal behavior. Normal judgment and thought content.   Urinalysis:      Assessment and Plan:  Pregnancy: G2P1001 at [redacted]w[redacted]d  1. Supervision of other normal pregnancy, antepartum Labor precautions - Cervicovaginal ancillary only( [place]) - Strep Gp B NAA   Normal growth on last U/S.   2. History of C-section For TOLAC  3. Alpha thalassemia silent carrier Stable  Term labor symptoms and general obstetric precautions including but not limited to vaginal bleeding, contractions, leaking of fluid and fetal movement were reviewed in detail with the  patient. Please refer to After Visit Summary for other counseling recommendations.  Return in about 1 week (around 07/15/2022) for OB visit, face to face, any provider.   Hermina Staggers, MD

## 2022-07-08 NOTE — Patient Instructions (Signed)
Vaginal Delivery  Vaginal delivery means that you give birth by pushing your baby out of your birth canal (vagina). Your health care team will help you before, during, and after vaginal delivery. Birth experiences are unique for every woman and every pregnancy, and birth experiences vary depending on where you choose to give birth. What are the risks and benefits? Generally, this is safe. However, problems may occur, including: Bleeding. Infection. Damage to other structures such as vaginal tearing. Allergic reactions to medicines. Despite the risks, benefits of vaginal delivery include less risk of bleeding and infection and a shorter recovery time compared to a Cesarean delivery. Cesarean delivery, or C-section, is the surgical delivery of a baby. What happens when I arrive at the birth center or hospital? Once you are in labor and have been admitted into the hospital or birth center, your health care team may: Review your pregnancy history and any concerns that you have. Talk with you about your birth plan and discuss pain control options. Check your blood pressure, breathing, and heartbeat. Assess your baby's heartbeat. Monitor your uterus for contractions. Check whether your bag of water (amniotic sac) has broken (ruptured). Insert an IV into one of your veins. This may be used to give you fluids and medicines. Monitoring Your health care team may assess your contractions (uterine monitoring) and your baby's heart rate (fetal monitoring). You may need to be monitored: Often, but not continuously (intermittently). All the time or for long periods at a time (continuously). Continuous monitoring may be needed if: You are taking certain medicines, such as medicine to relieve pain or make your contractions stronger. You have pregnancy or labor complications. Monitoring may be done by: Placing a special stethoscope or a handheld monitoring device on your abdomen to check your baby's  heartbeat and to check for contractions. Placing monitors on your abdomen (external monitors) to record your baby's heartbeat and the frequency and length of contractions. Placing monitors inside your uterus through your vagina (internal monitors) to record your baby's heartbeat and the frequency, length, and strength of your contractions. Depending on the type of monitor, it may remain in your uterus or on your baby's head until birth. Telemetry. This is a type of continuous monitoring that can be done with external or internal monitors. Instead of having to stay in bed, you are able to move around. Physical exam Your health care team may perform frequent physical exams. This may include: Checking how and where your baby is positioned in your uterus. Checking your cervix to determine: Whether it is thinning out (effacing). Whether it is opening up (dilating). What happens during labor and delivery?  Normal labor and delivery is divided into the following three stages: Stage 1 This is the longest stage of labor. Throughout this stage, you will feel contractions. Contractions generally feel mild, infrequent, and irregular at first. They get stronger, more frequent, and more regular as you move through this stage. You may have contractions about every 2-3 minutes. This stage ends when your cervix is completely dilated to 4 inches (10 cm) and completely effaced. Stage 2 This stage starts once your cervix is completely effaced and dilated and lasts until the delivery of your baby. This is the stage where you will feel an urge to push your baby out of your vagina. You may feel stretching and burning pain, especially when the widest part of your baby's head passes through the vaginal opening (crowning). Once your baby is delivered, the umbilical cord will be   clamped and cut. Timing of cutting the cord will depend on your wishes, your baby's health, and your health care provider's practices. Your baby  will be placed on your bare chest (skin-to-skin contact) in an upright position and covered with a warm blanket. If you are choosing to breastfeed, watch your baby for feeding cues, like rooting or sucking, and help the baby to your breast for his or her first feeding. Stage 3 This stage starts immediately after the birth of your baby and ends after you deliver the placenta. This stage may take anywhere from 5 to 30 minutes. After your baby has been delivered, you will feel contractions as your body expels the placenta. These contractions also help your uterus get smaller and reduce bleeding. What can I expect after labor and delivery? After labor is over, you and your baby will be assessed closely until you are ready to go home. Your health care team will teach you how to care for yourself and your baby. You and your baby may be encouraged to stay in the same room (rooming in) during your hospital stay. This will help promote early bonding and successful breastfeeding. Your uterus will be checked and massaged regularly (fundal massage). You may continue to receive fluids and medicines through an IV. You will have some soreness and pain in your abdomen, vagina, and the area of skin between your vaginal opening and your anus (perineum). If an incision was made near your vagina (episiotomy) or if you had some vaginal tearing during delivery, cold compresses may be placed on your episiotomy or your tear. This helps to reduce pain and swelling. It is normal to have vaginal bleeding after delivery. Wear a sanitary pad for vaginal bleeding and discharge. Summary Vaginal delivery means that you will give birth by pushing your baby out of your birth canal (vagina). Your health care team will monitor you and your baby throughout the stages of labor. After you deliver your baby, your health care team will continue to assess you and your baby to ensure you are both recovering as expected after delivery. This  information is not intended to replace advice given to you by your health care provider. Make sure you discuss any questions you have with your health care provider. Document Revised: 10/05/2020 Document Reviewed: 10/05/2020 Elsevier Patient Education  2023 Elsevier Inc.  

## 2022-07-10 LAB — STREP GP B NAA: Strep Gp B NAA: NEGATIVE

## 2022-07-11 LAB — CERVICOVAGINAL ANCILLARY ONLY
Chlamydia: NEGATIVE
Comment: NEGATIVE
Comment: NORMAL
Neisseria Gonorrhea: NEGATIVE

## 2022-07-14 ENCOUNTER — Inpatient Hospital Stay (HOSPITAL_COMMUNITY)
Admission: AD | Admit: 2022-07-14 | Discharge: 2022-07-14 | Disposition: A | Discharge status: Home / Self Care | Payer: Medicaid Other | Attending: Obstetrics and Gynecology | Admitting: Obstetrics and Gynecology

## 2022-07-14 ENCOUNTER — Encounter (HOSPITAL_COMMUNITY): Payer: Self-pay | Admitting: Obstetrics and Gynecology

## 2022-07-14 DIAGNOSIS — O26893 Other specified pregnancy related conditions, third trimester: Secondary | ICD-10-CM | POA: Diagnosis present

## 2022-07-14 DIAGNOSIS — Z3A37 37 weeks gestation of pregnancy: Secondary | ICD-10-CM | POA: Diagnosis not present

## 2022-07-14 DIAGNOSIS — R519 Headache, unspecified: Secondary | ICD-10-CM

## 2022-07-14 DIAGNOSIS — R110 Nausea: Secondary | ICD-10-CM | POA: Diagnosis not present

## 2022-07-14 DIAGNOSIS — R42 Dizziness and giddiness: Secondary | ICD-10-CM | POA: Diagnosis not present

## 2022-07-14 LAB — CBC
HCT: 23.8 % — ABNORMAL LOW (ref 36.0–46.0)
Hemoglobin: 7.7 g/dL — ABNORMAL LOW (ref 12.0–15.0)
MCH: 24.3 pg — ABNORMAL LOW (ref 26.0–34.0)
MCHC: 32.4 g/dL (ref 30.0–36.0)
MCV: 75.1 fL — ABNORMAL LOW (ref 80.0–100.0)
Platelets: 255 10*3/uL (ref 150–400)
RBC: 3.17 MIL/uL — ABNORMAL LOW (ref 3.87–5.11)
RDW: 14.1 % (ref 11.5–15.5)
WBC: 10.8 10*3/uL — ABNORMAL HIGH (ref 4.0–10.5)
nRBC: 0 % (ref 0.0–0.2)

## 2022-07-14 LAB — BASIC METABOLIC PANEL
Anion gap: 4 — ABNORMAL LOW (ref 5–15)
BUN: 5 mg/dL — ABNORMAL LOW (ref 6–20)
CO2: 21 mmol/L — ABNORMAL LOW (ref 22–32)
Calcium: 7.8 mg/dL — ABNORMAL LOW (ref 8.9–10.3)
Chloride: 114 mmol/L — ABNORMAL HIGH (ref 98–111)
Creatinine, Ser: 0.62 mg/dL (ref 0.44–1.00)
GFR, Estimated: >60 mL/min (ref >60)
Glucose, Bld: 87 mg/dL (ref 70–99)
Potassium: 3.1 mmol/L — ABNORMAL LOW (ref 3.5–5.1)
Sodium: 139 mmol/L (ref 135–145)

## 2022-07-14 LAB — URINALYSIS, ROUTINE W REFLEX MICROSCOPIC
Bilirubin Urine: NEGATIVE
Glucose, UA: NEGATIVE mg/dL
Hgb urine dipstick: NEGATIVE
Ketones, ur: NEGATIVE mg/dL
Nitrite: NEGATIVE
Protein, ur: 30 mg/dL — AB
Specific Gravity, Urine: 1.028 (ref 1.005–1.030)
pH: 6 (ref 5.0–8.0)

## 2022-07-14 MED ORDER — LACTATED RINGERS IV SOLN: Freq: Once | INTRAVENOUS | Status: DC

## 2022-07-14 MED ORDER — VITAMIN B-6 100 MG PO TABS
100.0000 mg | ORAL_TABLET | Freq: Once | ORAL | Status: AC
Start: 2022-07-14 — End: 2022-07-14
  Administered 2022-07-14: 100 mg via ORAL
  Filled 2022-07-14: qty 1

## 2022-07-14 MED ORDER — PYRIDOXINE HCL 100 MG/ML IJ SOLN: 100.0000 mg | Freq: Every day | INTRAMUSCULAR | Status: DC

## 2022-07-14 MED ORDER — ACETAMINOPHEN-CAFFEINE 500-65 MG PO TABS
2.0000 | ORAL_TABLET | Freq: Once | ORAL | Status: AC
  Administered 2022-07-14: 2 via ORAL
  Filled 2022-07-14: qty 2

## 2022-07-14 MED ORDER — ONDANSETRON 4 MG PO TBDP: 4.0000 mg | ORAL_TABLET | Freq: Four times a day (QID) | ORAL | 0 refills | Status: DC | PRN

## 2022-07-14 NOTE — MAU Provider Note (Signed)
Chief Complaint:  Headache, vision changes, and Dizziness   Event Date/Time   First Provider Initiated Contact with Patient 07/14/22 2154     HPI: Rebecca Bean is a 21 y.o. G2P1001 at 46w4dwho presents to maternity admissions reporting headache and dizziness for 2 weeks  Has not taken anything for it.   . She reports good fetal movement, denies LOF, vaginal bleeding, urinary symptoms, diarrhea, constipation or fever/chills.  She denies headache, visual changes or RUQ abdominal pain.  Headache  This is a recurrent problem. The current episode started 1 to 4 weeks ago. The problem has been unchanged. The quality of the pain is described as aching. The pain is at a severity of 6/10. Associated symptoms include dizziness and nausea (has had entire pregnancy). Pertinent negatives include no abdominal pain, back pain, blurred vision, fever, muscle aches or visual change. Nothing aggravates the symptoms. She has tried nothing for the symptoms.  Dizziness The current episode started 1 to 4 weeks ago. The problem has been waxing and waning. Associated symptoms include headaches and nausea (has had entire pregnancy). Pertinent negatives include no abdominal pain, fever or visual change. Nothing aggravates the symptoms. She has tried nothing for the symptoms.   RN Note: Rebecca Bean is a 21 y.o. at [redacted]w[redacted]d here in MAU reporting: HA, seeing floaters, and dizziness on and off for the last 2 weeks, and started today around 1000 today. Pt denies taking any pain medication for HA. Pt reports also had edema in her feet last week and was told to come in to MAU for evaluation but did not. Pt denies DFM, VB, LOF, right sided epigastric pain, current edema, abnormal discharge, and complications in the pregnancy.   Onset of complaint: 1000 Pain score: 6/10   Past Medical History: Past Medical History:  Diagnosis Date   Heart murmur     Past obstetric history: OB History  Gravida Para Term Preterm AB Living   2 1 1  0 0 1  SAB IAB Ectopic Multiple Live Births  0 0 0   1    # Outcome Date GA Lbr Len/2nd Weight Sex Delivery Anes PTL Lv  2 Current           1 Term 10/15/20 [redacted]w[redacted]d    CS-LTranv   LIV    Past Surgical History: Past Surgical History:  Procedure Laterality Date   CESAREAN SECTION N/A 10/15/2020   Procedure: CESAREAN SECTION;  Surgeon: 10/17/2020, MD;  Location: [facility] LD [facility]S;  Service: Obstetrics;  Laterality: N/A;   NO PAST SURGERIES     WISDOM TOOTH EXTRACTION      Family History: Family History  Problem Relation Age of Onset   Healthy Mother    Healthy Father    Diabetes Maternal Grandmother    Hypertension Maternal Grandmother    Asthma Neg Hx    Cancer Neg Hx    Heart disease Neg Hx    Stroke Neg Hx    Birth defects Neg Hx     Social History: Social History   Tobacco Use   Smoking status: Never   Smokeless tobacco: Never  Vaping Use   Vaping Use: Never used  Substance Use Topics   Alcohol use: Not Currently   Drug use: No    Allergies: No Known Allergies  Meds:  Medications Prior to Admission  Medication Sig Dispense Refill Last Dose   Prenatal Vit-Fe Phos-FA-Omega (VITAFOL GUMMIES) 3.33-0.333-34.8 MG CHEW Chew by mouth.   07/14/2022    I  have reviewed patient's Past Medical Hx, Surgical Hx, Family Hx, Social Hx, medications and allergies.   ROS:  Review of Systems  Constitutional:  Negative for fever.  Eyes:  Negative for blurred vision.  Gastrointestinal:  Positive for nausea (has had entire pregnancy). Negative for abdominal pain.  Musculoskeletal:  Negative for back pain.  Neurological:  Positive for dizziness and headaches.   Other systems negative  Physical Exam  Patient Vitals for the past 24 hrs:  BP Temp Temp src Pulse Resp SpO2 Height Weight  07/14/22 2139 132/76 98.4 F (36.9 C) Oral 86 18 100 % -- --  07/14/22 2135 -- -- -- -- -- -- 5\' 6"  (1.676 m) 94.7 kg   Constitutional: Well-developed, well-nourished female in no acute  distress.  Cardiovascular: normal rate and rhythm Respiratory: normal effort, clear to auscultation bilaterally GI: Abd soft, non-tender, gravid appropriate for gestational age.   No rebound or guarding. MS: Extremities nontender, no edema, normal ROM Neurologic: Alert and oriented x 4.  GU: Neg CVAT.  PELVIC EXAM: Deferred  FHT:  Baseline 140 , moderate variability, accelerations present, no decelerations Contractions:  Irregular     Labs: Results for orders placed or performed during the hospital encounter of 07/14/22 (from the past 24 hour(s))  Urinalysis, Routine w reflex microscopic     Status: Abnormal   Collection Time: 07/14/22  9:35 PM  Result Value Ref Range   Color, Urine YELLOW YELLOW   APPearance HAZY (A) CLEAR   Specific Gravity, Urine 1.028 1.005 - 1.030   pH 6.0 5.0 - 8.0   Glucose, UA NEGATIVE NEGATIVE mg/dL   Hgb urine dipstick NEGATIVE NEGATIVE   Bilirubin Urine NEGATIVE NEGATIVE   Ketones, ur NEGATIVE NEGATIVE mg/dL   Protein, ur 30 (A) NEGATIVE mg/dL   Nitrite NEGATIVE NEGATIVE   Leukocytes,Ua SMALL (A) NEGATIVE   RBC / HPF 0-5 0 - 5 RBC/hpf   WBC, UA 6-10 0 - 5 WBC/hpf   Bacteria, UA RARE (A) NONE SEEN   Squamous Epithelial / LPF 11-20 0 - 5   Mucus PRESENT   CBC     Status: Abnormal   Collection Time: 07/14/22 10:58 PM  Result Value Ref Range   WBC 10.8 (H) 4.0 - 10.5 K/uL   RBC 3.17 (L) 3.87 - 5.11 MIL/uL   Hemoglobin 7.7 (L) 12.0 - 15.0 g/dL   HCT 69.7 (L) 94.8 - 01.6 %   MCV 75.1 (L) 80.0 - 100.0 fL   MCH 24.3 (L) 26.0 - 34.0 pg   MCHC 32.4 30.0 - 36.0 g/dL   RDW 55.3 74.8 - 27.0 %   Platelets 255 150 - 400 K/uL   nRBC 0.0 0.0 - 0.2 %  Basic metabolic panel     Status: Abnormal   Collection Time: 07/14/22 10:58 PM  Result Value Ref Range   Sodium 139 135 - 145 mmol/L   Potassium 3.1 (L) 3.5 - 5.1 mmol/L   Chloride 114 (H) 98 - 111 mmol/L   CO2 21 (L) 22 - 32 mmol/L   Glucose, Bld 87 70 - 99 mg/dL   BUN 5 (L) 6 - 20 mg/dL   Creatinine,  Ser 7.86 0.44 - 1.00 mg/dL   Calcium 7.8 (L) 8.9 - 10.3 mg/dL   GFR, Estimated >75 >44 mL/min   Anion gap 4 (L) 5 - 15    O/Positive/-- (03/09 1519)  Imaging:   MAU Course/MDM: I have reviewed the triage vital signs and the nursing notes.   Pertinent  labs & imaging results that were available during my care of the patient were reviewed by me and considered in my medical decision making (see chart for details).      I have reviewed her medical records including past results, notes and treatments.   I have ordered labs and reviewed results.  Will order a liter of IVF for rehydration. >> declined NST reviewed, reactive  Treatments in MAU included Excedrin Tension for headache and B6 for nausea   Orthostatic VS normal  She got complete relief from both Declined IVF.    Assessment: [place] IUP at [redacted]w[redacted]d Headache Dizziness  Plan: Discharge home Discussed using TYlenol for headaches Discussed hydration and rest  Labor precautions and fetal kick counts Follow up in Office for prenatal visits and recheck Encouraged to return if she develops worsening of symptoms, increase in pain, fever, or other concerning symptoms.  Pt stable at time of discharge.  Wynelle Bourgeois CNM, MSN Certified Nurse-Midwife 07/14/2022 9:54 PM

## 2022-07-14 NOTE — MAU Note (Signed)
.  Rebecca Bean is a 21 y.o. at 110w4d here in MAU reporting: HA, seeing floaters, and dizziness on and off for the last 2 weeks, and started today around 1000 today. Pt denies taking any pain medication for HA. Pt reports also had edema in her feet last week and was told to come in to MAU for evaluation but did not. Pt denies DFM, VB, LOF, right sided epigastric pain, current edema, abnormal discharge, and complications in the pregnancy.   Onset of complaint: 1000 Pain score: 6/10  Vitals:   07/14/22 2139  BP: 132/76  Pulse: 86  Resp: 18  Temp: 98.4 F (36.9 C)  SpO2: 100%     FHT:157 Lab orders placed from triage:  UA

## 2022-07-15 ENCOUNTER — Ambulatory Visit (INDEPENDENT_AMBULATORY_CARE_PROVIDER_SITE_OTHER): Payer: Medicaid Other | Admitting: Obstetrics and Gynecology

## 2022-07-15 VITALS — BP 118/74 | HR 84 | Wt 209.0 lb

## 2022-07-15 DIAGNOSIS — Z98891 History of uterine scar from previous surgery: Secondary | ICD-10-CM

## 2022-07-15 DIAGNOSIS — D563 Thalassemia minor: Secondary | ICD-10-CM

## 2022-07-15 DIAGNOSIS — O34211 Maternal care for low transverse scar from previous cesarean delivery: Secondary | ICD-10-CM

## 2022-07-15 DIAGNOSIS — Z3480 Encounter for supervision of other normal pregnancy, unspecified trimester: Secondary | ICD-10-CM

## 2022-07-15 DIAGNOSIS — Z3A37 37 weeks gestation of pregnancy: Secondary | ICD-10-CM

## 2022-07-15 NOTE — Progress Notes (Signed)
Pt was seen at hospital last night - doing better since.

## 2022-07-15 NOTE — Progress Notes (Signed)
   PRENATAL VISIT NOTE  Subjective:  Rebecca Bean is a 21 y.o. G2P1001 at [redacted]w[redacted]d being seen today for ongoing prenatal care.  She is currently monitored for the following issues for this low-risk pregnancy and has History of C-section; Alpha thalassemia silent carrier; and Supervision of other normal pregnancy, antepartum on their problem list.  Patient doing well with no acute concerns today. She reports no complaints.  Contractions: Not present. Vag. Bleeding: None.  Movement: Present. Denies leaking of fluid.   The following portions of the patient's history were reviewed and updated as appropriate: allergies, current medications, past family history, past medical history, past social history, past surgical history and problem list. Problem list updated.  Objective:   Vitals:   07/15/22 0820  BP: 118/74  Pulse: 84  Weight: 209 lb (94.8 kg)    Fetal Status: Fetal Heart Rate (bpm): 135 Fundal Height: 38 cm Movement: Present     General:  Alert, oriented and cooperative. Patient is in no acute distress.  Skin: Skin is warm and dry. No rash noted.   Cardiovascular: Normal heart rate noted  Respiratory: Normal respiratory effort, no problems with respiration noted  Abdomen: Soft, gravid, appropriate for gestational age.  Pain/Pressure: Present     Pelvic: Cervical exam deferred        Extremities: Normal range of motion.     Mental Status:  Normal mood and affect. Normal behavior. Normal judgment and thought content.   Assessment and Plan:  Pregnancy: G2P1001 at [redacted]w[redacted]d  1. [redacted] weeks gestation of pregnancy   2. History of C-section Pt desires TOLAC  3. Alpha thalassemia silent carrier   4. Supervision of other normal pregnancy, antepartum Continue routine care, discuss IOL at next visit, per MFM delivery by 39-40 weeks  Term labor symptoms and general obstetric precautions including but not limited to vaginal bleeding, contractions, leaking of fluid and fetal movement were  reviewed in detail with the patient.  Please refer to After Visit Summary for other counseling recommendations.   Return in about 1 week (around 07/22/2022) for ROB, in person.   Mariel Aloe, MD Faculty Attending Center for [facility]

## 2022-07-22 ENCOUNTER — Encounter (HOSPITAL_COMMUNITY): Payer: Self-pay | Admitting: *Deleted

## 2022-07-22 ENCOUNTER — Ambulatory Visit (INDEPENDENT_AMBULATORY_CARE_PROVIDER_SITE_OTHER): Payer: Medicaid Other | Admitting: Obstetrics and Gynecology

## 2022-07-22 ENCOUNTER — Telehealth (HOSPITAL_COMMUNITY): Payer: Self-pay | Admitting: *Deleted

## 2022-07-22 VITALS — BP 121/81 | HR 81 | Wt 209.0 lb

## 2022-07-22 DIAGNOSIS — Z3490 Encounter for supervision of normal pregnancy, unspecified, unspecified trimester: Secondary | ICD-10-CM

## 2022-07-22 DIAGNOSIS — Z3A38 38 weeks gestation of pregnancy: Secondary | ICD-10-CM

## 2022-07-22 DIAGNOSIS — Z98891 History of uterine scar from previous surgery: Secondary | ICD-10-CM

## 2022-07-22 DIAGNOSIS — D563 Thalassemia minor: Secondary | ICD-10-CM

## 2022-07-22 DIAGNOSIS — O99013 Anemia complicating pregnancy, third trimester: Secondary | ICD-10-CM

## 2022-07-22 DIAGNOSIS — O34211 Maternal care for low transverse scar from previous cesarean delivery: Secondary | ICD-10-CM

## 2022-07-22 DIAGNOSIS — Z3480 Encounter for supervision of other normal pregnancy, unspecified trimester: Secondary | ICD-10-CM

## 2022-07-22 NOTE — Telephone Encounter (Signed)
Preadmission screen  

## 2022-07-22 NOTE — Progress Notes (Signed)
   PRENATAL VISIT NOTE  Subjective:  Rebecca Bean is a 21 y.o. G2P1001 at [redacted]w[redacted]d being seen today for ongoing prenatal care.  She is currently monitored for the following issues for this high-risk pregnancy and has History of C-section; Alpha thalassemia silent carrier; and Supervision of other normal pregnancy, antepartum on their problem list.  Patient doing well with no acute concerns today. She reports no complaints.  Contractions: Irregular. Vag. Bleeding: None.  Movement: Present. Denies leaking of fluid.   The following portions of the patient's history were reviewed and updated as appropriate: allergies, current medications, past family history, past medical history, past social history, past surgical history and problem list. Problem list updated.  Objective:   Vitals:   07/22/22 0907  BP: 121/81  Pulse: 81  Weight: 209 lb (94.8 kg)    Fetal Status: Fetal Heart Rate (bpm): 140 Fundal Height: 40 cm Movement: Present     General:  Alert, oriented and cooperative. Patient is in no acute distress.  Skin: Skin is warm and dry. No rash noted.   Cardiovascular: Normal heart rate noted  Respiratory: Normal respiratory effort, no problems with respiration noted  Abdomen: Soft, gravid, appropriate for gestational age. Small amount of edema around umbilicus and dependent portions of abdomen  Pain/Pressure: Present     Pelvic: Cervical exam deferred        Extremities: Normal range of motion.     Mental Status:  Normal mood and affect. Normal behavior. Normal judgment and thought content.   Assessment and Plan:  Pregnancy: G2P1001 at [redacted]w[redacted]d  1. [redacted] weeks gestation of pregnancy   2. History of C-section Pt desires TOLAC, consent previously signed  3. Alpha thalassemia silent carrier   4. Supervision of other normal pregnancy, antepartum Will attempt to schedule IOL per MFM recommendations between 39-40 weeks Pt deferred cervical exam today, advised pt if cervix is not  favorable this may prolong induction and increase risk of induction failure and conversion to repeat cesarean section  5. Encounter for elective induction of labor  - CBC; Standing - Type and screen; Standing - RPR; Standing - Comprehensive metabolic panel; Standing  Term labor symptoms and general obstetric precautions including but not limited to vaginal bleeding, contractions, leaking of fluid and fetal movement were reviewed in detail with the patient.  Please refer to After Visit Summary for other counseling recommendations.   No follow-ups on file.   Mariel Aloe, MD Faculty Attending Center for [facility]

## 2022-07-23 ENCOUNTER — Other Ambulatory Visit: Payer: Self-pay | Admitting: Advanced Practice Midwife

## 2022-07-26 ENCOUNTER — Other Ambulatory Visit: Payer: Self-pay

## 2022-07-26 ENCOUNTER — Encounter (HOSPITAL_COMMUNITY): Payer: Self-pay | Admitting: Obstetrics and Gynecology

## 2022-07-26 ENCOUNTER — Inpatient Hospital Stay (HOSPITAL_COMMUNITY)
Admission: AD | Admit: 2022-07-26 | Discharge: 2022-07-29 | DRG: 787 | Disposition: A | Discharge status: Home / Self Care | Payer: Medicaid Other | Attending: Obstetrics and Gynecology | Admitting: Obstetrics and Gynecology

## 2022-07-26 DIAGNOSIS — O34211 Maternal care for low transverse scar from previous cesarean delivery: Secondary | ICD-10-CM | POA: Diagnosis present

## 2022-07-26 DIAGNOSIS — D563 Thalassemia minor: Secondary | ICD-10-CM | POA: Diagnosis present

## 2022-07-26 DIAGNOSIS — Z3A39 39 weeks gestation of pregnancy: Secondary | ICD-10-CM | POA: Diagnosis not present

## 2022-07-26 DIAGNOSIS — O9902 Anemia complicating childbirth: Secondary | ICD-10-CM | POA: Diagnosis not present

## 2022-07-26 DIAGNOSIS — O26893 Other specified pregnancy related conditions, third trimester: Secondary | ICD-10-CM | POA: Diagnosis present

## 2022-07-26 DIAGNOSIS — Z3490 Encounter for supervision of normal pregnancy, unspecified, unspecified trimester: Secondary | ICD-10-CM

## 2022-07-26 DIAGNOSIS — D62 Acute posthemorrhagic anemia: Secondary | ICD-10-CM | POA: Diagnosis not present

## 2022-07-26 DIAGNOSIS — O9081 Anemia of the puerperium: Secondary | ICD-10-CM | POA: Diagnosis not present

## 2022-07-26 DIAGNOSIS — Z98891 History of uterine scar from previous surgery: Secondary | ICD-10-CM

## 2022-07-26 DIAGNOSIS — D649 Anemia, unspecified: Secondary | ICD-10-CM | POA: Diagnosis not present

## 2022-07-26 DIAGNOSIS — Z3480 Encounter for supervision of other normal pregnancy, unspecified trimester: Principal | ICD-10-CM

## 2022-07-26 DIAGNOSIS — Z3A00 Weeks of gestation of pregnancy not specified: Secondary | ICD-10-CM | POA: Diagnosis not present

## 2022-07-26 LAB — CBC
HCT: 19.8 % — ABNORMAL LOW (ref 36.0–46.0)
Hemoglobin: 6.4 g/dL — CL (ref 12.0–15.0)
MCH: 24.3 pg — ABNORMAL LOW (ref 26.0–34.0)
MCHC: 32.3 g/dL (ref 30.0–36.0)
MCV: 75.3 fL — ABNORMAL LOW (ref 80.0–100.0)
Platelets: 330 10*3/uL (ref 150–400)
RBC: 2.63 MIL/uL — ABNORMAL LOW (ref 3.87–5.11)
RDW: 14.6 % (ref 11.5–15.5)
WBC: 14.9 10*3/uL — ABNORMAL HIGH (ref 4.0–10.5)
nRBC: 0 % (ref 0.0–0.2)

## 2022-07-26 LAB — PREPARE RBC (CROSSMATCH)

## 2022-07-26 MED ORDER — LACTATED RINGERS IV SOLN: 500.0000 mL | INTRAVENOUS | Status: DC | PRN

## 2022-07-26 MED ORDER — ONDANSETRON HCL 4 MG/2ML IJ SOLN: 4.0000 mg | Freq: Four times a day (QID) | INTRAMUSCULAR | Status: DC | PRN

## 2022-07-26 MED ORDER — FENTANYL CITRATE (PF) 100 MCG/2ML IJ SOLN
50.0000 ug | INTRAMUSCULAR | Status: DC | PRN
  Administered 2022-07-27: 50 ug via INTRAVENOUS
  Administered 2022-07-27: 100 ug via INTRAVENOUS
  Filled 2022-07-26 (×2): qty 2

## 2022-07-26 MED ORDER — LIDOCAINE HCL (PF) 1 % IJ SOLN
30.0000 mL | INTRAMUSCULAR | Status: DC | PRN
Start: 2022-07-26 — End: 2022-07-27

## 2022-07-26 MED ORDER — SODIUM CHLORIDE 0.9% IV SOLUTION: Freq: Once | INTRAVENOUS | Status: AC

## 2022-07-26 MED ORDER — LACTATED RINGERS IV SOLN: INTRAVENOUS | Status: DC

## 2022-07-26 MED ORDER — SOD CITRATE-CITRIC ACID 500-334 MG/5ML PO SOLN
30.0000 mL | ORAL | Status: DC | PRN
  Filled 2022-07-26: qty 30

## 2022-07-26 MED ORDER — OXYTOCIN BOLUS FROM INFUSION: 333.0000 mL | Freq: Once | INTRAVENOUS | Status: DC

## 2022-07-26 MED ORDER — TERBUTALINE SULFATE 1 MG/ML IJ SOLN
0.2500 mg | Freq: Once | INTRAMUSCULAR | Status: DC | PRN
Start: 2022-07-26 — End: 2022-07-27

## 2022-07-26 MED ORDER — OXYTOCIN-SODIUM CHLORIDE 30-0.9 UT/500ML-% IV SOLN
1.0000 m[IU]/min | INTRAVENOUS | Status: DC
  Administered 2022-07-26: 2 m[IU]/min via INTRAVENOUS
  Filled 2022-07-26: qty 500

## 2022-07-26 MED ORDER — OXYTOCIN-SODIUM CHLORIDE 30-0.9 UT/500ML-% IV SOLN: 2.5000 [IU]/h | INTRAVENOUS | Status: DC

## 2022-07-26 MED ORDER — OXYCODONE-ACETAMINOPHEN 5-325 MG PO TABS: 1.0000 | ORAL_TABLET | ORAL | Status: DC | PRN

## 2022-07-26 MED ORDER — ACETAMINOPHEN 325 MG PO TABS: 650.0000 mg | ORAL_TABLET | ORAL | Status: DC | PRN

## 2022-07-26 NOTE — H&P (Addendum)
OBSTETRIC ADMISSION HISTORY AND PHYSICAL  Rebecca Bean is a 21 y.o. female G2P1001 with IUP at [redacted]w[redacted]d by LMP = [redacted]w[redacted]d [place] presenting for IOL due to TOLAC. She reports +FMs, No LOF, no VB, no blurry vision, headaches or peripheral edema, and RUQ pain.  She plans on bottle feeding. She request post placental IUD for birth control. She received her prenatal care at  [facility]    Dating: By LMP --->  Estimated Date of Delivery: 07/31/22  Sono:    @[redacted]w[redacted]d , CWD, normal anatomy, cephalic presentation,  2755g, 31% EFW   Prenatal History/Complications: Anemia affecting pregnancy  Past Medical History: Past Medical History:  Diagnosis Date   Heart murmur     Past Surgical History: Past Surgical History:  Procedure Laterality Date   CESAREAN SECTION N/A 10/15/2020   Procedure: CESAREAN SECTION;  Surgeon: 10/17/2020, MD;  Location: [facility] LD ORS;  Service: Obstetrics;  Laterality: N/A;   WISDOM TOOTH EXTRACTION      Obstetrical History: OB History     Gravida  2   Para  1   Term  1   Preterm  0   AB  0   Living  1      SAB  0   IAB  0   Ectopic  0   Multiple      Live Births  1           Social History Social History   Socioeconomic History   Marital status: Single    Spouse name: Not on file   Number of children: Not on file   Years of education: Not on file   Highest education level: Not on file  Occupational History   Not on file  Tobacco Use   Smoking status: Never   Smokeless tobacco: Never  Vaping Use   Vaping Use: Never used  Substance and Sexual Activity   Alcohol use: Not Currently   Drug use: No   Sexual activity: Yes    Partners: Male  Other Topics Concern   Not on file  Social History Narrative   ** Merged History Encounter **       Social Determinants of Health   Financial Resource Strain: Not on file  Food Insecurity: No Food Insecurity (07/26/2022)   Hunger Vital Sign    Worried About Running Out of Food in the Last Year:  Never true    Ran Out of Food in the Last Year: Never true  Transportation Needs: No Transportation Needs (07/26/2022)   PRAPARE - 09/25/2022 (Medical): No    Lack of Transportation (Non-Medical): No  Physical Activity: Not on file  Stress: Not on file  Social Connections: Not on file    Family History: Family History  Problem Relation Age of Onset   Healthy Mother    Healthy Father    Diabetes Maternal Grandmother    Hypertension Maternal Grandmother    Asthma Neg Hx    Cancer Neg Hx    Heart disease Neg Hx    Stroke Neg Hx    Birth defects Neg Hx     Allergies: No Known Allergies  Medications Prior to Admission  Medication Sig Dispense Refill Last Dose   Prenatal Vit-Fe Phos-FA-Omega (VITAFOL GUMMIES) 3.33-0.333-34.8 MG CHEW Chew by mouth.   Past Week   ondansetron (ZOFRAN-ODT) 4 MG disintegrating tablet Take 1 tablet (4 mg total) by mouth every 6 (six) hours as needed for nausea. 20 tablet 0  Review of Systems   All systems reviewed and negative except as stated in HPI  Blood pressure 135/81, pulse 88, height 5\' 6"  (1.676 m), weight 94.9 kg, last menstrual period 10/24/2021, not currently breastfeeding. General appearance: alert, cooperative, and appears stated age Lungs: clear to auscultation bilaterally Heart: regular rate and rhythm Abdomen: soft, non-tender; bowel sounds normal Pelvic: no abnormalities Extremities: Homans sign is negative, no sign of DVT DTR's Intact Presentation: cephalic Fetal monitoringBaseline: 145 bpm, Variability: Good {> 6 bpm), Accelerations: Reactive, and Decelerations: Absent Uterine activityFrequency: Every 10+ minutes     Prenatal labs: ABO, Rh: O/Positive/-- (03/09 1519) Antibody: Negative (03/09 1519) Rubella: 10.90 (03/09 1519) RPR: Non Reactive (06/23 1018)  HBsAg: Negative (03/09 1519)  HIV: Non Reactive (06/23 1018)  GBS: Negative/-- (08/18 0918)  1 hr Glucola passed (83) Genetic  screening  NIPS: negative, Horizon: alpha thal Anatomy 12-24-1998 appears normal  Prenatal Transfer Tool  Maternal Diabetes: No Genetic Screening: Normal Maternal Ultrasounds/Referrals: Normal Fetal Ultrasounds or other Referrals:  None Maternal Substance Abuse:  No Significant Maternal Medications:  None Significant Maternal Lab Results:  GBS negative Number of Prenatal Visits:greater than 3 verified prenatal visits Other Comments:  None  No results found for this or any previous visit (from the past 24 hour(s)).  Patient Active Problem List   Diagnosis Date Noted   Encounter for elective induction of labor 07/26/2022   Supervision of other normal pregnancy, antepartum 01/27/2022   Alpha thalassemia silent carrier 03/23/2020   History of C-section 02/10/2020    Assessment/Plan:  Ares Tegtmeyer is a 21 y.o. G2P1001 at [redacted]w[redacted]d here for IOL due to [facility].  #Labor:Will start augmentation with pitocin 2x2 and FB. #Pain: Maternal and spousal support. Plans for eventual epidural.  #FWB: Cat I tracing #ID: GBS negative #MOF: Bottle #MOC: IUD #Circ:  N/A #Anemia affecting pregnancy: Hgb 07/14/22 was 7.7. Repeat CBC pending. Will monitor for blood loss during labor process and transfuse if Hgb < 7.  07/16/22, MD  Resident Physician 07/26/2022, 8:22 PM   Fellow Attestation  I saw and evaluated the patient, performing the key elements of the service.I  personally performed or re-performed the history, physical exam, and medical decision making activities of this service and have verified that the service and findings are accurately documented in the resident's note. I developed the management plan that is described in the resident's note, and I agree with the content, with my edits above.    09/25/2022, MD OB Fellow

## 2022-07-27 ENCOUNTER — Inpatient Hospital Stay (HOSPITAL_COMMUNITY): Payer: Medicaid Other | Admitting: Anesthesiology

## 2022-07-27 ENCOUNTER — Encounter (HOSPITAL_COMMUNITY)
Admission: AD | Disposition: A | Discharge status: Home / Self Care | Payer: Self-pay | Source: Home / Self Care | Attending: Obstetrics and Gynecology

## 2022-07-27 ENCOUNTER — Encounter (HOSPITAL_COMMUNITY): Payer: Self-pay | Admitting: Obstetrics and Gynecology

## 2022-07-27 ENCOUNTER — Other Ambulatory Visit: Payer: Self-pay

## 2022-07-27 ENCOUNTER — Inpatient Hospital Stay (HOSPITAL_COMMUNITY): Payer: Medicaid Other

## 2022-07-27 DIAGNOSIS — O9902 Anemia complicating childbirth: Secondary | ICD-10-CM

## 2022-07-27 DIAGNOSIS — O34211 Maternal care for low transverse scar from previous cesarean delivery: Secondary | ICD-10-CM

## 2022-07-27 DIAGNOSIS — Z3A00 Weeks of gestation of pregnancy not specified: Secondary | ICD-10-CM | POA: Diagnosis not present

## 2022-07-27 DIAGNOSIS — D649 Anemia, unspecified: Secondary | ICD-10-CM | POA: Diagnosis not present

## 2022-07-27 DIAGNOSIS — Z3A39 39 weeks gestation of pregnancy: Secondary | ICD-10-CM | POA: Diagnosis not present

## 2022-07-27 LAB — CBC
HCT: 31.3 % — ABNORMAL LOW (ref 36.0–46.0)
Hemoglobin: 10.2 g/dL — ABNORMAL LOW (ref 12.0–15.0)
MCH: 24.8 pg — ABNORMAL LOW (ref 26.0–34.0)
MCHC: 32.6 g/dL (ref 30.0–36.0)
MCV: 76.2 fL — ABNORMAL LOW (ref 80.0–100.0)
Platelets: 253 10*3/uL (ref 150–400)
RBC: 4.11 MIL/uL (ref 3.87–5.11)
RDW: 16.1 % — ABNORMAL HIGH (ref 11.5–15.5)
WBC: 12.8 10*3/uL — ABNORMAL HIGH (ref 4.0–10.5)
nRBC: 0 % (ref 0.0–0.2)

## 2022-07-27 LAB — COMPREHENSIVE METABOLIC PANEL
ALT: 11 U/L (ref 0–44)
AST: 19 U/L (ref 15–41)
Albumin: 2.7 g/dL — ABNORMAL LOW (ref 3.5–5.0)
Alkaline Phosphatase: 167 U/L — ABNORMAL HIGH (ref 38–126)
Anion gap: 10 (ref 5–15)
BUN: 8 mg/dL (ref 6–20)
CO2: 20 mmol/L — ABNORMAL LOW (ref 22–32)
Calcium: 8.5 mg/dL — ABNORMAL LOW (ref 8.9–10.3)
Chloride: 109 mmol/L (ref 98–111)
Creatinine, Ser: 0.81 mg/dL (ref 0.44–1.00)
GFR, Estimated: >60 mL/min (ref >60)
Glucose, Bld: 84 mg/dL (ref 70–99)
Potassium: 3.2 mmol/L — ABNORMAL LOW (ref 3.5–5.1)
Sodium: 139 mmol/L (ref 135–145)
Total Bilirubin: 0.2 mg/dL — ABNORMAL LOW (ref 0.3–1.2)
Total Protein: 6.4 g/dL — ABNORMAL LOW (ref 6.5–8.1)

## 2022-07-27 LAB — RPR: RPR Ser Ql: NONREACTIVE

## 2022-07-27 SURGERY — Surgical Case
Anesthesia: Spinal

## 2022-07-27 SURGERY — Surgical Case
Anesthesia: Regional

## 2022-07-27 MED ORDER — MORPHINE SULFATE (PF) 0.5 MG/ML IJ SOLN
INTRAMUSCULAR | Status: DC | PRN
  Administered 2022-07-27: .15 mg via INTRATHECAL

## 2022-07-27 MED ORDER — OXYTOCIN-SODIUM CHLORIDE 30-0.9 UT/500ML-% IV SOLN
2.5000 [IU]/h | INTRAVENOUS | Status: AC
  Administered 2022-07-27: 2.5 [IU]/h via INTRAVENOUS
  Filled 2022-07-27: qty 500

## 2022-07-27 MED ORDER — DEXAMETHASONE SODIUM PHOSPHATE 4 MG/ML IJ SOLN
INTRAMUSCULAR | Status: DC | PRN
  Administered 2022-07-27: 4 mg via INTRAVENOUS

## 2022-07-27 MED ORDER — FENTANYL CITRATE (PF) 100 MCG/2ML IJ SOLN
INTRAMUSCULAR | Status: AC
  Filled 2022-07-27: qty 2

## 2022-07-27 MED ORDER — KETOROLAC TROMETHAMINE 30 MG/ML IJ SOLN
30.0000 mg | Freq: Four times a day (QID) | INTRAMUSCULAR | Status: AC | PRN
  Administered 2022-07-27: 30 mg via INTRAMUSCULAR

## 2022-07-27 MED ORDER — ONDANSETRON HCL 4 MG/2ML IJ SOLN
4.0000 mg | Freq: Three times a day (TID) | INTRAMUSCULAR | Status: DC | PRN
  Administered 2022-07-27: 4 mg via INTRAVENOUS
  Filled 2022-07-27: qty 2

## 2022-07-27 MED ORDER — LACTATED RINGERS IV SOLN: INTRAVENOUS | Status: DC

## 2022-07-27 MED ORDER — PHENYLEPHRINE 80 MCG/ML (10ML) SYRINGE FOR IV PUSH (FOR BLOOD PRESSURE SUPPORT)
PREFILLED_SYRINGE | INTRAVENOUS | Status: AC
  Filled 2022-07-27: qty 10

## 2022-07-27 MED ORDER — DEXAMETHASONE SODIUM PHOSPHATE 4 MG/ML IJ SOLN
INTRAMUSCULAR | Status: AC
  Filled 2022-07-27: qty 1

## 2022-07-27 MED ORDER — OXYTOCIN-SODIUM CHLORIDE 30-0.9 UT/500ML-% IV SOLN
INTRAVENOUS | Status: DC | PRN
  Administered 2022-07-27: 200 mL via INTRAVENOUS

## 2022-07-27 MED ORDER — FENTANYL CITRATE (PF) 100 MCG/2ML IJ SOLN
INTRAMUSCULAR | Status: DC | PRN
  Administered 2022-07-27: 15 ug via INTRATHECAL

## 2022-07-27 MED ORDER — ATROPINE SULFATE 0.4 MG/ML IV SOLN
INTRAVENOUS | Status: AC
  Filled 2022-07-27: qty 1

## 2022-07-27 MED ORDER — DIPHENHYDRAMINE HCL 25 MG PO CAPS: 25.0000 mg | ORAL_CAPSULE | Freq: Four times a day (QID) | ORAL | Status: DC | PRN

## 2022-07-27 MED ORDER — DIPHENHYDRAMINE HCL 50 MG/ML IJ SOLN: 12.5000 mg | INTRAMUSCULAR | Status: DC | PRN

## 2022-07-27 MED ORDER — COCONUT OIL OIL: 1.0000 | TOPICAL_OIL | Status: DC | PRN

## 2022-07-27 MED ORDER — SODIUM CHLORIDE 0.9% FLUSH: 3.0000 mL | INTRAVENOUS | Status: DC | PRN

## 2022-07-27 MED ORDER — DIPHENHYDRAMINE HCL 25 MG PO CAPS: 25.0000 mg | ORAL_CAPSULE | ORAL | Status: DC | PRN

## 2022-07-27 MED ORDER — ACETAMINOPHEN 10 MG/ML IV SOLN: 1000.0000 mg | Freq: Once | INTRAVENOUS | Status: DC | PRN

## 2022-07-27 MED ORDER — PHENYLEPHRINE HCL-NACL 20-0.9 MG/250ML-% IV SOLN
INTRAVENOUS | Status: AC
  Filled 2022-07-27: qty 250

## 2022-07-27 MED ORDER — PHENYLEPHRINE HCL-NACL 20-0.9 MG/250ML-% IV SOLN
INTRAVENOUS | Status: DC | PRN
  Administered 2022-07-27: 60 ug/min via INTRAVENOUS

## 2022-07-27 MED ORDER — BUPIVACAINE IN DEXTROSE 0.75-8.25 % IT SOLN
INTRATHECAL | Status: DC | PRN
  Administered 2022-07-27: 1.8 mL via INTRATHECAL

## 2022-07-27 MED ORDER — SENNOSIDES-DOCUSATE SODIUM 8.6-50 MG PO TABS
2.0000 | ORAL_TABLET | ORAL | Status: DC
  Administered 2022-07-28 – 2022-07-29 (×2): 2 via ORAL
  Filled 2022-07-27 (×2): qty 2

## 2022-07-27 MED ORDER — MENTHOL 3 MG MT LOZG
1.0000 | LOZENGE | OROMUCOSAL | Status: DC | PRN
Start: 2022-07-27 — End: 2022-07-29

## 2022-07-27 MED ORDER — DIBUCAINE (PERIANAL) 1 % EX OINT: 1.0000 | TOPICAL_OINTMENT | CUTANEOUS | Status: DC | PRN

## 2022-07-27 MED ORDER — ACETAMINOPHEN 10 MG/ML IV SOLN
INTRAVENOUS | Status: DC | PRN
  Administered 2022-07-27: 1000 mg via INTRAVENOUS

## 2022-07-27 MED ORDER — PRENATAL MULTIVITAMIN CH
1.0000 | ORAL_TABLET | Freq: Every day | ORAL | Status: DC
  Administered 2022-07-28: 1 via ORAL
  Filled 2022-07-27: qty 1

## 2022-07-27 MED ORDER — CEFAZOLIN SODIUM-DEXTROSE 2-4 GM/100ML-% IV SOLN
2.0000 g | INTRAVENOUS | Status: AC
  Administered 2022-07-27: 2 g via INTRAVENOUS
  Filled 2022-07-27: qty 100

## 2022-07-27 MED ORDER — MEASLES, MUMPS & RUBELLA VAC IJ SOLR: 0.5000 mL | Freq: Once | INTRAMUSCULAR | Status: DC

## 2022-07-27 MED ORDER — NALOXONE HCL 4 MG/10ML IJ SOLN: 1.0000 ug/kg/h | INTRAVENOUS | Status: DC | PRN

## 2022-07-27 MED ORDER — SIMETHICONE 80 MG PO CHEW
80.0000 mg | CHEWABLE_TABLET | Freq: Three times a day (TID) | ORAL | Status: DC
  Administered 2022-07-28 – 2022-07-29 (×2): 80 mg via ORAL
  Filled 2022-07-27 (×3): qty 1

## 2022-07-27 MED ORDER — KETOROLAC TROMETHAMINE 30 MG/ML IJ SOLN
INTRAMUSCULAR | Status: AC
  Filled 2022-07-27: qty 1

## 2022-07-27 MED ORDER — MORPHINE SULFATE (PF) 0.5 MG/ML IJ SOLN
INTRAMUSCULAR | Status: AC
  Filled 2022-07-27: qty 10

## 2022-07-27 MED ORDER — ONDANSETRON HCL 4 MG/2ML IJ SOLN
INTRAMUSCULAR | Status: DC | PRN
  Administered 2022-07-27: 4 mg via INTRAVENOUS

## 2022-07-27 MED ORDER — SOD CITRATE-CITRIC ACID 500-334 MG/5ML PO SOLN
30.0000 mL | ORAL | Status: AC
  Administered 2022-07-27: 30 mL via ORAL
  Filled 2022-07-27: qty 30

## 2022-07-27 MED ORDER — KETOROLAC TROMETHAMINE 30 MG/ML IJ SOLN: 30.0000 mg | Freq: Four times a day (QID) | INTRAMUSCULAR | Status: AC | PRN

## 2022-07-27 MED ORDER — ACETAMINOPHEN 500 MG PO TABS
1000.0000 mg | ORAL_TABLET | Freq: Four times a day (QID) | ORAL | Status: DC
  Administered 2022-07-28 – 2022-07-29 (×3): 1000 mg via ORAL
  Filled 2022-07-27 (×4): qty 2

## 2022-07-27 MED ORDER — SODIUM CHLORIDE 0.9 % IV SOLN
500.0000 mg | INTRAVENOUS | Status: AC
  Administered 2022-07-27: 500 mg via INTRAVENOUS
  Filled 2022-07-27: qty 5

## 2022-07-27 MED ORDER — SODIUM CHLORIDE 0.9 % IR SOLN
Status: DC | PRN
  Administered 2022-07-27: 1

## 2022-07-27 MED ORDER — SCOPOLAMINE 1 MG/3DAYS TD PT72
1.0000 | MEDICATED_PATCH | Freq: Once | TRANSDERMAL | Status: DC
  Administered 2022-07-27: 1.5 mg via TRANSDERMAL
  Filled 2022-07-27: qty 1

## 2022-07-27 MED ORDER — ENOXAPARIN SODIUM 40 MG/0.4ML IJ SOSY
40.0000 mg | PREFILLED_SYRINGE | INTRAMUSCULAR | Status: DC
  Administered 2022-07-28: 40 mg via SUBCUTANEOUS
  Filled 2022-07-27 (×2): qty 0.4

## 2022-07-27 MED ORDER — FENTANYL CITRATE (PF) 100 MCG/2ML IJ SOLN
25.0000 ug | INTRAMUSCULAR | Status: DC | PRN
  Administered 2022-07-27 (×2): 25 ug via INTRAVENOUS
  Administered 2022-07-27: 50 ug via INTRAVENOUS

## 2022-07-27 MED ORDER — PHENYLEPHRINE HCL (PRESSORS) 10 MG/ML IV SOLN
INTRAVENOUS | Status: DC | PRN
  Administered 2022-07-27 (×3): 80 ug via INTRAVENOUS

## 2022-07-27 MED ORDER — ONDANSETRON HCL 4 MG/2ML IJ SOLN
INTRAMUSCULAR | Status: AC
  Filled 2022-07-27: qty 2

## 2022-07-27 MED ORDER — OXYCODONE HCL 5 MG PO TABS: 5.0000 mg | ORAL_TABLET | ORAL | Status: DC | PRN

## 2022-07-27 MED ORDER — NALOXONE HCL 0.4 MG/ML IJ SOLN: 0.4000 mg | INTRAMUSCULAR | Status: DC | PRN

## 2022-07-27 MED ORDER — WITCH HAZEL-GLYCERIN EX PADS: 1.0000 | MEDICATED_PAD | CUTANEOUS | Status: DC | PRN

## 2022-07-27 MED ORDER — ACETAMINOPHEN 500 MG PO TABS: 1000.0000 mg | ORAL_TABLET | Freq: Four times a day (QID) | ORAL | Status: DC

## 2022-07-27 MED ORDER — STERILE WATER FOR IRRIGATION IR SOLN
Status: DC | PRN
  Administered 2022-07-27: 1000 mL

## 2022-07-27 MED ORDER — IBUPROFEN 600 MG PO TABS
600.0000 mg | ORAL_TABLET | Freq: Four times a day (QID) | ORAL | Status: DC
  Administered 2022-07-27 – 2022-07-29 (×5): 600 mg via ORAL
  Filled 2022-07-27 (×5): qty 1

## 2022-07-27 MED ORDER — SIMETHICONE 80 MG PO CHEW: 80.0000 mg | CHEWABLE_TABLET | ORAL | Status: DC | PRN

## 2022-07-27 MED ORDER — ZOLPIDEM TARTRATE 5 MG PO TABS: 5.0000 mg | ORAL_TABLET | Freq: Every evening | ORAL | Status: DC | PRN

## 2022-07-27 SURGICAL SUPPLY — 31 items
BENZOIN TINCTURE PRP APPL 2/3 (GAUZE/BANDAGES/DRESSINGS) ×1 IMPLANT
CHLORAPREP W/TINT 26ML (MISCELLANEOUS) ×2 IMPLANT
CLAMP CORD UMBIL (MISCELLANEOUS) ×1 IMPLANT
CLOTH BEACON ORANGE TIMEOUT ST (SAFETY) ×1 IMPLANT
DRSG OPSITE POSTOP 4X10 (GAUZE/BANDAGES/DRESSINGS) ×1 IMPLANT
ELECT REM PT RETURN 9FT ADLT (ELECTROSURGICAL) ×1
ELECTRODE REM PT RTRN 9FT ADLT (ELECTROSURGICAL) ×1 IMPLANT
EXTRACTOR VACUUM M CUP 4 TUBE (SUCTIONS) IMPLANT
GAUZE SPONGE 4X4 12PLY STRL LF (GAUZE/BANDAGES/DRESSINGS) IMPLANT
GLOVE BIOGEL PI IND STRL 7.0 (GLOVE) ×2 IMPLANT
GLOVE BIOGEL PI IND STRL 7.5 (GLOVE) ×2 IMPLANT
GLOVE ECLIPSE 7.5 STRL STRAW (GLOVE) ×1 IMPLANT
GOWN STRL REUS W/TWL LRG LVL3 (GOWN DISPOSABLE) ×3 IMPLANT
KIT ABG SYR 3ML LUER SLIP (SYRINGE) IMPLANT
NDL HYPO 25X5/8 SAFETYGLIDE (NEEDLE) IMPLANT
NEEDLE HYPO 25X5/8 SAFETYGLIDE (NEEDLE) IMPLANT
NS IRRIG 1000ML POUR BTL (IV SOLUTION) ×1 IMPLANT
PACK C SECTION WH (CUSTOM PROCEDURE TRAY) ×1 IMPLANT
PAD ABD 7.5X8 STRL (GAUZE/BANDAGES/DRESSINGS) IMPLANT
PAD OB MATERNITY 4.3X12.25 (PERSONAL CARE ITEMS) ×1 IMPLANT
PENCIL SMOKE EVAC W/HOLSTER (ELECTROSURGICAL) IMPLANT
RTRCTR C-SECT PINK 25CM LRG (MISCELLANEOUS) ×1 IMPLANT
STRIP CLOSURE SKIN 1/2X4 (GAUZE/BANDAGES/DRESSINGS) ×1 IMPLANT
SUT PLAIN 0 NONE (SUTURE) ×1 IMPLANT
SUT VIC AB 0 CT1 36 (SUTURE) ×3 IMPLANT
SUT VIC AB 2-0 CT1 27 (SUTURE) ×1
SUT VIC AB 2-0 CT1 TAPERPNT 27 (SUTURE) ×1 IMPLANT
SUT VIC AB 4-0 KS 27 (SUTURE) ×1 IMPLANT
TOWEL OR 17X24 6PK STRL BLUE (TOWEL DISPOSABLE) ×1 IMPLANT
TRAY FOLEY W/BAG SLVR 14FR LF (SET/KITS/TRAYS/PACK) ×1 IMPLANT
WATER STERILE IRR 1000ML POUR (IV SOLUTION) ×1 IMPLANT

## 2022-07-27 NOTE — Progress Notes (Signed)
Patient is here for IOL at term. She has a history of one c/s for fetal indications. She is not in labor, has been ripened with pitocin. She is intact. Category 1 tracing. She now requests cesarean section. We discussed risks/benefits of that vs TOLAC and she is clear in wanting repeat c/s.   The risks of cesarean section were discussed with the patient including but were not limited to: bleeding which may require transfusion or reoperation; infection which may require antibiotics; injury to bowel, bladder, ureters or other surrounding organs; injury to the fetus; need for additional procedures including hysterectomy in the event of a life-threatening hemorrhage; placental abnormalities wth subsequent pregnancies, incisional problems, thromboembolic phenomenon and other postoperative/anesthesia complications.  P  Patient has been NPO since midnight she will remain NPO for procedure. Anesthesia and OR aware.  Preoperative prophylactic antibiotics and SCDs ordered on call to the OR.  To OR when ready.

## 2022-07-27 NOTE — Anesthesia Preprocedure Evaluation (Signed)
Anesthesia Evaluation  Patient identified by MRN, date of birth, ID band Patient awake    Reviewed: Allergy & Precautions, NPO status , Patient's Chart, lab work & pertinent test results  Airway Mallampati: II  TM Distance: >3 FB Neck ROM: Full    Dental no notable dental hx.    Pulmonary neg pulmonary ROS,    Pulmonary exam normal breath sounds clear to auscultation       Cardiovascular negative cardio ROS Normal cardiovascular exam Rhythm:Regular Rate:Normal     Neuro/Psych negative neurological ROS  negative psych ROS   GI/Hepatic negative GI ROS, Neg liver ROS,   Endo/Other  negative endocrine ROS  Renal/GU negative Renal ROS  negative genitourinary   Musculoskeletal negative musculoskeletal ROS (+)   Abdominal   Peds  Hematology  (+) Blood dyscrasia (Hgb 6.4-->10.2 s/p 2U RBCs), anemia ,   Anesthesia Other Findings Repeat C/S  Reproductive/Obstetrics (+) Pregnancy                             Anesthesia Physical Anesthesia Plan  ASA: 2  Anesthesia Plan: Spinal   Post-op Pain Management:    Induction:   PONV Risk Score and Plan: Treatment may vary due to age or medical condition  Airway Management Planned: Natural Airway  Additional Equipment:   Intra-op Plan:   Post-operative Plan:   Informed Consent: I have reviewed the patients History and Physical, chart, labs and discussed the procedure including the risks, benefits and alternatives for the proposed anesthesia with the patient or authorized representative who has indicated his/her understanding and acceptance.     Dental advisory given  Plan Discussed with: CRNA  Anesthesia Plan Comments:         Anesthesia Quick Evaluation

## 2022-07-27 NOTE — Anesthesia Procedure Notes (Signed)
Spinal  Patient location during procedure: OR Start time: 07/27/2022 1:10 PM End time: 07/27/2022 1:12 PM Reason for block: surgical anesthesia Staffing Performed: anesthesiologist  Anesthesiologist: Elmer Picker, MD Performed by: Elmer Picker, MD Authorized by: Elmer Picker, MD   Preanesthetic Checklist Completed: patient identified, IV checked, risks and benefits discussed, surgical consent, monitors and equipment checked, pre-op evaluation and timeout performed Spinal Block Patient position: sitting Prep: DuraPrep and site prepped and draped Patient monitoring: cardiac monitor, continuous pulse ox and blood pressure Approach: midline Location: L3-4 Injection technique: single-shot Needle Needle type: Pencan  Needle gauge: 24 G Needle length: 9 cm Assessment Sensory level: T6 Events: CSF return Additional Notes Functioning IV was confirmed and monitors were applied. Sterile prep and drape, including hand hygiene and sterile gloves were used. The patient was positioned and the spine was prepped. The skin was anesthetized with lidocaine.  Free flow of clear CSF was obtained prior to injecting local anesthetic into the CSF.  The spinal needle aspirated freely following injection.  The needle was carefully withdrawn.  The patient tolerated the procedure well.

## 2022-07-27 NOTE — Progress Notes (Signed)
LABOR PROGRESS NOTE  Rebecca Bean is a 21 y.o. G2P1001 at [redacted]w[redacted]d  admitted for IOL. TOLAC.   Subjective: Sleeping when I enter the room. Comfortable   Objective: BP 125/72   Pulse 76   Temp 98.4 F (36.9 C) (Oral)   Resp 16   Ht 5\' 6"  (1.676 m)   Wt 94.9 kg   LMP 10/24/2021 (Approximate)   SpO2 100%   BMI 33.77 kg/m  or  Vitals:   07/27/22 0152 07/27/22 0232 07/27/22 0251 07/27/22 0252  BP: 127/68 116/70 125/72 125/72  Pulse: 73 70 66 76  Resp: 16 15 16 16   Temp: 98.5 F (36.9 C) 98.1 F (36.7 C) 98.4 F (36.9 C)   TempSrc: Oral Oral Oral   SpO2:      Weight:      Height:       Dilation: Fingertip Station: -2 Presentation: Vertex Exam by:: Unnamed Zeien MD FHT: baseline rate 125, moderate varibility, + acel, no decel Toco: every 2-3 min   Labs: Lab Results  Component Value Date   WBC 14.9 (H) 07/26/2022   HGB 6.4 (LL) 07/26/2022   HCT 19.8 (L) 07/26/2022   MCV 75.3 (L) 07/26/2022   PLT 330 07/26/2022    Patient Active Problem List   Diagnosis Date Noted   Encounter for elective induction of labor 07/26/2022   Supervision of other normal pregnancy, antepartum 01/27/2022   Alpha thalassemia silent carrier 03/23/2020   History of C-section 02/10/2020    Assessment / Plan: 21 y.o. G2P1001 at [redacted]w[redacted]d here for IOL.   Labor: Slow progression. Continuing to titrate pitocin. Unsuccessful foley balloon placement at this check Fetal Wellbeing:  cat 1 Pain Control:  prn epidural  Anticipated MOD:  Vaginal  Anemia: Hgb 6.4 on arrival, receiving 2 units PRBC at this time   26, MD  07/27/2022, 3:33 AM

## 2022-07-27 NOTE — Transfer of Care (Signed)
Immediate Anesthesia Transfer of Care Note  Patient: Rebecca Bean  Procedure(s) Performed: CESAREAN SECTION  Patient Location: PACU  Anesthesia Type:Spinal  Level of Consciousness: awake, alert  and oriented  Airway & Oxygen Therapy: Patient Spontanous Breathing  Post-op Assessment: Report given to RN and Post -op Vital signs reviewed and stable  Post vital signs: Reviewed and stable  Last Vitals:  Vitals Value Taken Time  BP 121/68 07/27/22 1432  Temp    Pulse 64 07/27/22 1436  Resp 17 07/27/22 1436  SpO2 100 % 07/27/22 1436  Vitals shown include unvalidated device data.  Last Pain:  Vitals:   07/27/22 1000  TempSrc:   PainSc: 0-No pain         Complications: No notable events documented.

## 2022-07-27 NOTE — Anesthesia Postprocedure Evaluation (Signed)
Anesthesia Post Note  Patient: Rebecca Bean  Procedure(s) Performed: CESAREAN SECTION     Patient location during evaluation: PACU Anesthesia Type: Spinal Level of consciousness: oriented and awake and alert Pain management: pain level controlled Vital Signs Assessment: post-procedure vital signs reviewed and stable Respiratory status: spontaneous breathing, respiratory function stable and patient connected to nasal cannula oxygen Cardiovascular status: blood pressure returned to baseline and stable Postop Assessment: no headache, no backache and no apparent nausea or vomiting Anesthetic complications: no   No notable events documented.  Last Vitals:  Vitals:   07/27/22 1543 07/27/22 1620  BP: 121/72 124/79  Pulse: 72 98  Resp: (!) 181 18  Temp: (!) 36.4 C 36.6 C  SpO2: 100% 100%    Last Pain:  Vitals:   07/27/22 1620  TempSrc: Oral  PainSc:    Pain Goal:                   Rebecca Bean

## 2022-07-27 NOTE — Op Note (Addendum)
Cesarean Section Operative Report  Rebecca Bean  07/26/2022 - 07/27/2022  Indications: history prior cesarean, maternal request  Pre-operative Diagnosis: repeat low transverse cesarean section  Post-operative Diagnosis: Same   Surgeon: Surgeon(s) and Role:    * Moana Munford, Wilfred Curtis, MD - Primary    * Patrcia Dolly, MD - Fellow   Attending Attestation: I was present and scrubbed for the entire procedure.   An experienced assistant was required given the standard of surgical care given the complexity of the case.  This assistant was needed for exposure, dissection, suctioning, retraction, instrument exchange, assisting with delivery with administration of fundal pressure, and for overall help during the procedure.  Anesthesia: epidural    Quantified Blood Loss: 379 ml  Total IV Fluids: 1300 ml LR  Urine Output:: 50 ml clear yellow urine  Specimens: none  Findings: Viable female infant in cephalic presentation; Apgars pending; weight pending; arterial cord pH not obtained;  clear amniotic fluid; intact placenta with three vessel cord; normal uterus, fallopian tubes and ovaries bilaterally.  Baby condition / location:  Couplet care / Skin to Skin   Complications: no complications  Indications: Rebecca Bean is a 21 y.o. [id number] with an IUP [redacted]w[redacted]d who presented on 9/5 for elective induction of labor. Initial plan was TOLAC but on hospital day 1 patient changed her mind and requested to proceed with elective repeat cesarean section  The risks, benefits, complications, treatment options, and exected outcomes were discussed with the patient . The patient dwith the proposed plan, giving informed consent. identified as [name] and the procedure verified as C-Section Delivery.  Procedure Details:  The patient was taken back to the operative suite where epidual anesthesia was dosed.  A time out was held and the above information confirmed.   After induction of  anesthesia, the patient was draped and prepped in the usual sterile manner and placed in a dorsal supine position with a leftward tilt. A Pfannenstiel incision was made and carried down through the subcutaneous tissue to the fascia. Fascial incision was made and sharply extended transversely. The fascia was separated from the underlying rectus tissue superiorly and inferiorly. The peritoneum was identified and sharply entered and extended longitudinally. Alexis retractor was placed. A bladder flap was created. A low transverse uterine incision was made and extended bluntly. Delivered from cephalic presentation was a viable infant with Apgars and weight as above.  After waiting 60 seconds for delayed cord cutting, the umbilical cord was clamped and cut cord blood was obtained for evaluation. Cord ph was not sent. The placenta was removed Intact and appeared normal. The uterine outline, tubes and ovaries appeared normal. The uterine incision was closed with running unlocked sutures 0-Vicryl in one layer.   Hemostasis was observed. The peritoneum was closed with 2-0 vicryl. The rectus muscles were examined and hemostasis observed. The fascia was then reapproximated with running sutures of 0-Vicryl.. The skin was closed with 4-0 Vicryl.  Instrument, sponge, and needle counts were correct prior the abdominal closure and were correct at the conclusion of the case.     Disposition: PACU - hemodynamically stable.   Maternal Condition: stable       Signed: Lavonne Chick 07/27/2022 2:40 PM

## 2022-07-28 LAB — TYPE AND SCREEN
ABO/RH(D): O POS
Antibody Screen: NEGATIVE
Unit division: 0
Unit division: 0

## 2022-07-28 LAB — BPAM RBC
Blood Product Expiration Date: 202310032359
Blood Product Expiration Date: 202310032359
ISSUE DATE / TIME: 202309052342
ISSUE DATE / TIME: 202309060222
Unit Type and Rh: 5100
Unit Type and Rh: 5100

## 2022-07-28 LAB — CBC
HCT: 28 % — ABNORMAL LOW (ref 36.0–46.0)
Hemoglobin: 9.2 g/dL — ABNORMAL LOW (ref 12.0–15.0)
MCH: 25.2 pg — ABNORMAL LOW (ref 26.0–34.0)
MCHC: 32.9 g/dL (ref 30.0–36.0)
MCV: 76.7 fL — ABNORMAL LOW (ref 80.0–100.0)
Platelets: 258 10*3/uL (ref 150–400)
RBC: 3.65 MIL/uL — ABNORMAL LOW (ref 3.87–5.11)
RDW: 15.6 % — ABNORMAL HIGH (ref 11.5–15.5)
WBC: 17.3 10*3/uL — ABNORMAL HIGH (ref 4.0–10.5)
nRBC: 0 % (ref 0.0–0.2)

## 2022-07-28 LAB — BIRTH TISSUE RECOVERY COLLECTION (PLACENTA DONATION)

## 2022-07-28 NOTE — Progress Notes (Signed)
POSTPARTUM PROGRESS NOTE  POD #1  Subjective:  Rebecca Bean is a 21 y.o. [id number] s/p elective rLTCS at [redacted]w[redacted]d.  She reports she doing well. No acute events overnight. She reports she is doing well. She denies any problems with ambulating, voiding or po intake. Denies nausea or vomiting. She has not passed flatus. Pain is well controlled.  Lochia is minimal.  Objective: Blood pressure 121/88, pulse 64, temperature 98.4 F (36.9 C), temperature source Oral, resp. rate 18, height 5\' 6"  (1.676 m), weight 94.9 kg, last menstrual period 10/24/2021, SpO2 100 %, unknown if currently breastfeeding.  Physical Exam:  General: alert, cooperative and no distress Chest: no respiratory distress Heart:regular rate, distal pulses intact Abdomen: soft, nontender,  Uterine Fundus: firm, appropriately tender DVT Evaluation: No calf swelling or tenderness Extremities: No LE edema Skin: warm, dry; incision clean/dry/intact w/ pressure dressing in place  Recent Labs    07/27/22 0801 07/28/22 0518  HGB 10.2* 9.2*  HCT 31.3* 28.0*    Assessment/Plan: Rebecca Bean is a 21 y.o. 26 s/p elective rLTCS at [redacted]w[redacted]d   POD#1 - Doing welll; pain well controlled. H/H appropriate  Routine postpartum care  OOB, ambulated  Lovenox for VTE prophylaxis Anemia: s/p 2 unit PRBCs  Contraception: IUD Feeding: bottle  Dispo: Plan for discharge 9/8-9/10.   LOS: 2 days   08-23-1976, DO OB Fellow, [facility] 07/28/2022, 12:09 PM

## 2022-07-29 ENCOUNTER — Encounter: Payer: Medicaid Other | Admitting: Obstetrics & Gynecology

## 2022-07-29 ENCOUNTER — Other Ambulatory Visit (HOSPITAL_COMMUNITY): Payer: Self-pay

## 2022-07-29 DIAGNOSIS — D62 Acute posthemorrhagic anemia: Secondary | ICD-10-CM

## 2022-07-29 MED ORDER — ACETAMINOPHEN 500 MG PO TABS
1000.0000 mg | ORAL_TABLET | Freq: Four times a day (QID) | ORAL | 0 refills | Status: AC
  Filled 2022-07-29: qty 30, 4d supply, fill #0

## 2022-07-29 MED ORDER — IBUPROFEN 600 MG PO TABS
600.0000 mg | ORAL_TABLET | Freq: Four times a day (QID) | ORAL | 0 refills | Status: AC
  Filled 2022-07-29: qty 30, 8d supply, fill #0

## 2022-07-29 MED ORDER — OXYCODONE HCL 5 MG PO TABS
5.0000 mg | ORAL_TABLET | ORAL | 0 refills | Status: AC | PRN
Start: 2022-07-29 — End: ?
  Filled 2022-07-29: qty 30, 5d supply, fill #0

## 2022-07-29 NOTE — Discharge Summary (Signed)
Postpartum Discharge Summary      Patient Name: Rebecca Bean DOB: [birth year] MRN: [id number]  Date of admission: 07/26/2022 Delivery date:07/27/2022  Delivering provider: Laurey Arrow B[facility]  Date of discharge: 07/29/2022  Admitting diagnosis: Encounter for elective induction of labor [Z34.90] Intrauterine pregnancy: [redacted]w[redacted]d    Secondary diagnosis:  Principal Problem:   Encounter for elective induction of labor Active Problems:   Acute blood loss anemia  Additional problems: n/a    Discharge diagnosis: Term Pregnancy Delivered and Anemia                                              Post partum procedures:blood transfusion Augmentation: Pitocin Complications: None  Hospital course: Induction of Labor With Cesarean Section   21y.o. yo [id number] admitted to the hospital 07/26/2022 for induction of labor for TOLAC. Patient had a labor course significant for unable to introduce FB and was started on pitocin. The patient went for cesarean section due to Elective Repeat after shared decision making. Delivery details are as follows: Membrane Rupture Time/Date: 1:38 PM ,07/27/2022   Delivery Method:C-Section, Low Transverse  Details of operation can be found in separate operative Note.  Patient received 2 uPRBCs for 6.4 hgb; EBL was [id number] is ambulating, tolerating a regular diet, passing flatus, and urinating well.  Patient is discharged home in stable condition on 07/29/22.      Newborn Data: Birth date:07/27/2022  Birth time:1:38 PM  Gender:Female  Living status:Living  Apgars:8 ,9  Weight:3090 g                                Magnesium Sulfate received: No BMZ received: No Rhophylac:No MMR:No T-DaP: Offered postpartum Flu: N/A Transfusion:Yes  Physical exam  Vitals:   07/28/22 0556 07/28/22 1429 07/28/22 2102 07/29/22 0534  BP: 121/88 127/70 125/74 121/68  Pulse: 64 81 72 60  Resp: _0 Temp: 98.4 F (36.9 C) 98.2 F (36.8 C) 99.2 F (37.3 C) 98.7  F (37.1 C)  TempSrc: Oral Oral Oral Oral  SpO2: 100% 100%    Weight:      Height:       General: alert, cooperative, and no distress Lochia: appropriate Uterine Fundus: firm Incision: Healing well with no significant drainage DVT Evaluation: No evidence of DVT seen on physical exam. Labs: Lab Results  Component Value Date   WBC 17.3 (H) 07/28/2022   HGB 9.2 (L) 07/28/2022   HCT 28.0 (L) 07/28/2022   MCV 76.7 (L) 07/28/2022   PLT 258 07/28/2022      Latest Ref Rng & Units 07/26/2022    7:45 PM  CMP  Glucose 70 - 99 mg/dL 84   BUN 6 - 20 mg/dL 8   Creatinine 0.44 - 1.00 mg/dL 0.81   Sodium 135 - 145 mmol/L 139   Potassium 3.5 - 5.1 mmol/L 3.2   Chloride 98 - 111 mmol/L 109   CO2 22 - 32 mmol/L 20   Calcium 8.9 - 10.3 mg/dL 8.5   Total Protein 6.5 - 8.1 g/dL 6.4   Total Bilirubin 0.3 - 1.2 mg/dL 0.2   Alkaline Phos 38 - 126 U/L 167   AST 15 - 41 U/L 19   ALT 0 - 44 U/L 11  Edinburgh Score:    07/28/2022    9:02 PM  Edinburgh Postnatal Depression Scale Screening Tool  I have been able to laugh and see the funny side of things. 0  I have looked forward with enjoyment to things. 0  I have blamed myself unnecessarily when things went wrong. 0  I have been anxious or worried for no good reason. 0  I have felt scared or panicky for no good reason. 0  Things have been getting on top of me. 0  I have been so unhappy that I have had difficulty sleeping. 0  I have felt sad or miserable. 0  I have been so unhappy that I have been crying. 0  The thought of harming myself has occurred to me. 0  Edinburgh Postnatal Depression Scale Total 0     After visit meds:  Allergies as of 07/29/2022   No Known Allergies      Medication List     STOP taking these medications    ondansetron 4 MG disintegrating tablet Commonly known as: ZOFRAN-ODT       TAKE these medications    acetaminophen 500 MG tablet Commonly known as: TYLENOL Take 2 tablets (1,000 mg total) by  mouth every 6 (six) hours.   ibuprofen 600 MG tablet Commonly known as: ADVIL Take 1 tablet (600 mg total) by mouth every 6 (six) hours.   oxyCODONE 5 MG immediate release tablet Commonly known as: Oxy IR/ROXICODONE Take 1 tablet (5 mg total) by mouth every 4 (four) hours as needed for moderate pain.   Vitafol Gummies 3.33-0.333-34.8 MG Chew Chew by mouth.         Discharge home in stable condition Infant Feeding: Bottle Infant Disposition:rooming in Discharge instruction: per After Visit Summary and Postpartum booklet. Activity: Advance as tolerated. Pelvic rest for 6 weeks.  Diet: routine diet Future Appointments:No future appointments. Follow up Visit:   Please schedule this patient for a In person postpartum visit in 4 weeks with the following provider: MD and APP. Additional Postpartum F/U:Incision check 1 week  Low risk pregnancy complicated by:  n/a Delivery mode:  C-Section, Low Transverse  Anticipated Birth Control:  IUD   07/29/2022 Naaman Plummer Autry-Lott, DO

## 2022-11-04 ENCOUNTER — Ambulatory Visit: Payer: Medicaid Other | Admitting: Obstetrics

## 2023-05-11 ENCOUNTER — Other Ambulatory Visit (HOSPITAL_COMMUNITY)
Admission: RE | Admit: 2023-05-11 | Discharge: 2023-05-11 | Disposition: A | Discharge status: Home / Self Care | Payer: Medicaid Other | Source: Ambulatory Visit | Attending: Obstetrics and Gynecology | Admitting: Obstetrics and Gynecology

## 2023-05-11 ENCOUNTER — Encounter: Payer: Self-pay | Admitting: Obstetrics and Gynecology

## 2023-05-11 ENCOUNTER — Ambulatory Visit (INDEPENDENT_AMBULATORY_CARE_PROVIDER_SITE_OTHER): Payer: Medicaid Other | Admitting: Obstetrics and Gynecology

## 2023-05-11 VITALS — BP 115/73 | HR 90 | Ht 66.0 in | Wt 203.0 lb

## 2023-05-11 DIAGNOSIS — Z1339 Encounter for screening examination for other mental health and behavioral disorders: Secondary | ICD-10-CM | POA: Diagnosis not present

## 2023-05-11 DIAGNOSIS — Z113 Encounter for screening for infections with a predominantly sexual mode of transmission: Secondary | ICD-10-CM | POA: Insufficient documentation

## 2023-05-11 DIAGNOSIS — Z30430 Encounter for insertion of intrauterine contraceptive device: Secondary | ICD-10-CM

## 2023-05-11 DIAGNOSIS — Z01419 Encounter for gynecological examination (general) (routine) without abnormal findings: Secondary | ICD-10-CM | POA: Insufficient documentation

## 2023-05-11 DIAGNOSIS — Z3202 Encounter for pregnancy test, result negative: Secondary | ICD-10-CM | POA: Diagnosis not present

## 2023-05-11 LAB — POCT URINE PREGNANCY: Preg Test, Ur: NEGATIVE

## 2023-05-11 MED ORDER — LEVONORGESTREL 20.1 MCG/DAY IU IUD: 1.0000 | INTRAUTERINE_SYSTEM | Freq: Once | INTRAUTERINE | Status: DC

## 2023-05-11 MED ORDER — LEVONORGESTREL 20 MCG/DAY IU IUD
1.0000 | INTRAUTERINE_SYSTEM | Freq: Once | INTRAUTERINE | Status: AC
  Administered 2023-05-11: 1 via INTRAUTERINE

## 2023-05-11 NOTE — Addendum Note (Signed)
Addended by: Maretta Bees on: 05/11/2023 09:20 AM   Modules accepted: Orders

## 2023-05-11 NOTE — Progress Notes (Signed)
GYNECOLOGY ANNUAL PREVENTATIVE CARE ENCOUNTER NOTE  History:     Rebecca Bean is a 22 y.o. [id number] female here for a routine annual gynecologic exam.  Current complaints: desires IUD placement.   Denies abnormal vaginal bleeding, discharge, pelvic pain, problems with intercourse or other gynecologic concerns. Pt had unprotected intercourse prior to June 8th.   Gynecologic History Patient's last menstrual period was 04/29/2023 (exact date). Contraception: IUD Last Pap: negative   Obstetric History OB History  Gravida Para Term Preterm AB Living  2 2 2  0 0 2  SAB IAB Ectopic Multiple Live Births  0 0 0 0 2    # Outcome Date GA Lbr Len/2nd Weight Sex Delivery Anes PTL Lv  2 Term 07/27/22 [redacted]w[redacted]d  6 lb 13 oz (3.09 kg) F CS-LTranv Spinal  LIV  1 Term 10/15/20 [redacted]w[redacted]d  6 lb (2.722 kg) M CS-LTranv EPI  LIV     Complications: Fetal Intolerance    Past Medical History:  Diagnosis Date   Heart murmur     Past Surgical History:  Procedure Laterality Date   CESAREAN SECTION N/A 10/15/2020   Procedure: CESAREAN SECTION;  Surgeon: Tereso Newcomer, MD;  Location: [facility];  Service: Obstetrics;  Laterality: N/A;   CESAREAN SECTION N/A 07/27/2022   Procedure: CESAREAN SECTION;  Surgeon: Kathrynn Running, MD;  Location: [facility];  Service: Obstetrics;  Laterality: N/A;   WISDOM TOOTH EXTRACTION      Current Outpatient Medications on File Prior to Visit  Medication Sig Dispense Refill   acetaminophen (TYLENOL) 500 MG tablet Take 2 tablets (1,000 mg total) by mouth every 6 (six) hours. 30 tablet 0   ibuprofen (ADVIL) 600 MG tablet Take 1 tablet (600 mg total) by mouth every 6 (six) hours. 30 tablet 0   oxyCODONE (OXY IR/ROXICODONE) 5 MG immediate release tablet Take 1 tablet (5 mg total) by mouth every 4 (four) hours as needed for moderate pain. 30 tablet 0   Prenatal Vit-Fe Phos-FA-Omega (VITAFOL GUMMIES) 3.33-0.333-34.8 MG CHEW Chew by mouth.     No current facility-administered  medications on file prior to visit.    No Known Allergies  Social History:  reports that she has never smoked. She has never used smokeless tobacco. She reports that she does not currently use alcohol. She reports that she does not use drugs.  Family History  Problem Relation Age of Onset   Healthy Mother    Healthy Father    Diabetes Maternal Grandmother    Hypertension Maternal Grandmother    Asthma Neg Hx    Cancer Neg Hx    Heart disease Neg Hx    Stroke Neg Hx    Birth defects Neg Hx     The following portions of the patient's history were reviewed and updated as appropriate: allergies, current medications, past family history, past medical history, past social history, past surgical history and problem list.  Review of Systems Pertinent items noted in HPI and remainder of comprehensive ROS otherwise negative.  Physical Exam:  BP 115/73   Pulse 90   Ht 5\' 6"  (1.676 m)   Wt 203 lb (92.1 kg)   LMP 04/29/2023 (Exact Date)   BMI 32.77 kg/m  CONSTITUTIONAL: Well-developed, well-nourished female in no acute distress.  HENT:  Normocephalic, atraumatic, External right and left ear normal. Oropharynx is clear and moist EYES: Conjunctivae and EOM are normal.  NECK: Normal range of motion, supple, no masses.  Normal thyroid.  SKIN: Skin  is warm and dry. No rash noted. Not diaphoretic. No erythema. No pallor. MUSCULOSKELETAL: Normal range of motion. No tenderness.  No cyanosis, clubbing, or edema.  2+ distal pulses. NEUROLOGIC: Alert and oriented to person, place, and time. Normal reflexes, muscle tone coordination.  PSYCHIATRIC: Normal mood and affect. Normal behavior. Normal judgment and thought content. CARDIOVASCULAR: Normal heart rate noted, regular rhythm RESPIRATORY: Clear to auscultation bilaterally. Effort and breath sounds normal, no problems with respiration noted. BREASTS: Symmetric in size. No masses, tenderness, skin changes, nipple drainage, or lymphadenopathy  bilaterally. Performed in the presence of a chaperone. ABDOMEN: Soft, no distention noted.  No tenderness, rebound or guarding.  PELVIC: Normal appearing external genitalia and urethral meatus; normal appearing vaginal mucosa and cervix.  No abnormal discharge noted.  Pap smear obtained.  Normal uterine size, no other palpable masses, no uterine or adnexal tenderness.  Performed in the presence of a chaperone.   Assessment and Plan:    1. Women's annual routine gynecological examination Normal annual exam Pap pending - Cytology - PAP( [place])  2. Routine screening for STI (sexually transmitted infection) Per pt request  - Cervicovaginal ancillary only( [place]) - HIV antibody (with reflex) - Hepatitis C Antibody - Hepatitis B Surface AntiGEN - [facility]  3. Encounter for IUD insertion See separate note - POCT urine pregnancy - levonorgestrel (LILETTA) 20.1 MCG/DAY IUD 1 each  Will follow up results of pap smear and manage accordingly. Routine preventative health maintenance measures emphasized. Please refer to After Visit Summary for other counseling recommendations.      Mariel Aloe, MD, FACOG Obstetrician & Gynecologist, [facility] for [facility], [facility]

## 2023-05-11 NOTE — Progress Notes (Signed)
    GYNECOLOGY OFFICE PROCEDURE NOTE  Rebecca Bean is a 22 y.o. [id number] here for Mirena IUD insertion. No GYN concerns.  Last pap smear was on done today.   IUD Insertion Procedure Note Patient identified, informed consent performed, consent signed.   Discussed risks of irregular bleeding, cramping, infection, malpositioning or misplacement of the IUD outside the uterus which may require further procedure such as laparoscopy. Also discussed >99% contraception efficacy, increased risk of ectopic pregnancy with failure of method.  Time out was performed.  Urine pregnancy test negative.  Speculum placed in the vagina.  Cervix visualized.  Cleaned with Betadine x 2.  Grasped anteriorly with a single tooth tenaculum.  Uterus sounded to 8 cm.  Mirena IUD placed per manufacturer's recommendations.  Strings trimmed to 3 cm. Tenaculum was removed, good hemostasis noted.  Patient tolerated procedure well.   Patient was given post-procedure instructions.  She was advised to have backup contraception for one week.  Patient was also asked to check IUD strings periodically and follow up in 4 weeks for IUD check.   Mariel Aloe, MD, FACOG Obstetrician & Gynecologist, [facility] for [facility], [facility]

## 2023-05-11 NOTE — Progress Notes (Addendum)
22 y.o. GYN presents for AEX/PAP/STD screening. Pt wants IUD Insertion, last unprotected sex was sometime before Father's Day.  UPT today Negative  Administrations This Visit     levonorgestrel (MIRENA) 20 MCG/DAY IUD 1 each     Admin Date 05/11/2023 Action Given Dose 1 each Route Intrauterine Administered By Maretta Bees, RMA

## 2023-05-12 LAB — HEPATITIS C ANTIBODY: Hep C Virus Ab: NONREACTIVE

## 2023-05-12 LAB — CYTOLOGY - PAP
Comment: NEGATIVE
Diagnosis: NEGATIVE
High risk HPV: NEGATIVE

## 2023-05-12 LAB — CERVICOVAGINAL ANCILLARY ONLY
Chlamydia: NEGATIVE
Comment: NEGATIVE
Comment: NORMAL
Neisseria Gonorrhea: NEGATIVE

## 2023-05-12 LAB — RPR: RPR Ser Ql: NONREACTIVE

## 2023-05-12 LAB — HIV ANTIBODY (ROUTINE TESTING W REFLEX): HIV Screen 4th Generation wRfx: NONREACTIVE

## 2023-05-12 LAB — HEPATITIS B SURFACE ANTIGEN: Hepatitis B Surface Ag: NEGATIVE

## 2023-06-07 ENCOUNTER — Ambulatory Visit: Payer: Medicaid Other | Admitting: Obstetrics and Gynecology

## 2023-12-28 ENCOUNTER — Ambulatory Visit (HOSPITAL_COMMUNITY): Payer: Medicaid Other

## 2024-01-21 ENCOUNTER — Ambulatory Visit (HOSPITAL_COMMUNITY): Payer: Self-pay

## 2024-09-16 ENCOUNTER — Encounter: Payer: Self-pay | Admitting: Obstetrics

## 2024-09-16 ENCOUNTER — Ambulatory Visit (INDEPENDENT_AMBULATORY_CARE_PROVIDER_SITE_OTHER): Admitting: Obstetrics

## 2024-09-16 ENCOUNTER — Other Ambulatory Visit (HOSPITAL_COMMUNITY)
Admission: RE | Admit: 2024-09-16 | Discharge: 2024-09-16 | Disposition: A | Discharge status: Home / Self Care | Source: Ambulatory Visit | Attending: Obstetrics | Admitting: Obstetrics

## 2024-09-16 VITALS — BP 116/75 | HR 76 | Ht 66.0 in | Wt 220.6 lb

## 2024-09-16 DIAGNOSIS — Z01419 Encounter for gynecological examination (general) (routine) without abnormal findings: Secondary | ICD-10-CM | POA: Insufficient documentation

## 2024-09-16 DIAGNOSIS — N898 Other specified noninflammatory disorders of vagina: Secondary | ICD-10-CM | POA: Diagnosis not present

## 2024-09-16 DIAGNOSIS — Z3009 Encounter for other general counseling and advice on contraception: Secondary | ICD-10-CM

## 2024-09-16 MED ORDER — METRONIDAZOLE 0.75 % VA GEL: 1.0000 | Freq: Two times a day (BID) | VAGINAL | 2 refills | Status: AC

## 2024-09-16 NOTE — Progress Notes (Unsigned)
 Pt. Presents for annual. Pt believes that her iron  is low. No other questions or concerns at this time. Pt would like to come back for lab only visit to get blood std testing and her iron  checked.

## 2024-09-16 NOTE — Progress Notes (Unsigned)
 Subjective:        Rebecca Bean is a 23 y.o. female here for a routine exam.  Current complaints: Vaginal discharge.    Personal health questionnaire:  Is patient Ashkenazi Jewish, have a family history of breast and/or ovarian cancer: no Is there a family history of uterine cancer diagnosed at age < 37, gastrointestinal cancer, urinary tract cancer, family member who is a Personnel Officer syndrome-associated carrier: no Is the patient overweight and hypertensive, family history of diabetes, personal history of gestational diabetes, preeclampsia or PCOS: no Is patient over 63, have PCOS,  family history of premature CHD under age 22, diabetes, smoke, have hypertension or peripheral artery disease:  no At any time, has a partner hit, kicked or otherwise hurt or frightened you?: no Over the past 2 weeks, have you felt down, depressed or hopeless?: no Over the past 2 weeks, have you felt little interest or pleasure in doing things?:no   Gynecologic History No LMP recorded (lmp unknown). (Menstrual status: IUD). Contraception: none Last Pap: 05-11-2023. Results were: normal Last mammogram: n/a. Results were: n/a  Obstetric History OB History  Gravida Para Term Preterm AB Living  2 2 2  0 0 2  SAB IAB Ectopic Multiple Live Births  0 0 0 0 2    # Outcome Date GA Lbr Len/2nd Weight Sex Type Anes PTL Lv  2 Term 07/27/22 [redacted]w[redacted]d  6 lb 13 oz (3.09 kg) F CS-LTranv Spinal  LIV  1 Term 10/15/20 [redacted]w[redacted]d  6 lb (2.722 kg) M CS-LTranv EPI  LIV     Complications: Fetal Intolerance    Past Medical History:  Diagnosis Date   Heart murmur     Past Surgical History:  Procedure Laterality Date   CESAREAN SECTION N/A 10/15/2020   Procedure: CESAREAN SECTION;  Surgeon: Herchel Gloris LABOR, MD;  Location: [facility] LD [facility];  Service: Obstetrics;  Laterality: N/A;   CESAREAN SECTION N/A 07/27/2022   Procedure: CESAREAN SECTION;  Surgeon: Kandis Devaughn Sayres, MD;  Location: [facility] LD [facility];  Service: Obstetrics;  Laterality:  N/A;   WISDOM TOOTH EXTRACTION       Current Outpatient Medications:    metroNIDAZOLE  (METROGEL ) 0.75 % vaginal gel, Place 1 Applicatorful vaginally 2 (two) times daily., Disp: 70 g, Rfl: 2   acetaminophen  (TYLENOL ) 500 MG tablet, Take 2 tablets (1,000 mg total) by mouth every 6 (six) hours. (Patient not taking: Reported on 09/16/2024), Disp: 30 tablet, Rfl: 0   ibuprofen  (ADVIL ) 600 MG tablet, Take 1 tablet (600 mg total) by mouth every 6 (six) hours. (Patient not taking: Reported on 09/16/2024), Disp: 30 tablet, Rfl: 0   oxyCODONE  (OXY IR/ROXICODONE ) 5 MG immediate release tablet, Take 1 tablet (5 mg total) by mouth every 4 (four) hours as needed for moderate pain. (Patient not taking: Reported on 09/16/2024), Disp: 30 tablet, Rfl: 0   Prenatal Vit-Fe Phos-FA-Omega (VITAFOL  GUMMIES) 3.33-0.333-34.8 MG CHEW, Chew by mouth. (Patient not taking: Reported on 09/16/2024), Disp: , Rfl:  No Known Allergies  Social History   Tobacco Use   Smoking status: Never   Smokeless tobacco: Never  Substance Use Topics   Alcohol use: Not Currently    Family History  Problem Relation Age of Onset   Healthy Mother    Healthy Father    Diabetes Maternal Grandmother    Hypertension Maternal Grandmother    Asthma Neg Hx    Cancer Neg Hx    Heart disease Neg Hx    Stroke Neg Hx  Birth defects Neg Hx       Review of Systems  Constitutional: negative for fatigue and weight loss Respiratory: negative for cough and wheezing Cardiovascular: negative for chest pain, fatigue and palpitations Gastrointestinal: negative for abdominal pain and change in bowel habits Musculoskeletal:negative for myalgias Neurological: negative for gait problems and tremors Behavioral/Psych: negative for abusive relationship, depression Endocrine: negative for temperature intolerance    Genitourinary: positive for vaginal discharge.  negative for abnormal menstrual periods, genital lesions, hot flashes, sexual problems   Integument/breast: negative for breast lump, breast tenderness, nipple discharge and skin lesion(s)    Objective:       BP 116/75   Pulse 76   Ht 5' 6 (1.676 m)   Wt 220 lb 9.6 oz (100.1 kg)   LMP  (LMP Unknown)   BMI 35.61 kg/m  General:   Alert and no distress  Skin:   no rash or abnormalities  Lungs:   clear to auscultation bilaterally  Heart:   regular rate and rhythm, S1, S2 normal, no murmur, click, rub or gallop  Breasts:   normal without suspicious masses, skin or nipple changes or axillary nodes  Abdomen:  normal findings: no organomegaly, soft, non-tender and no hernia  Pelvis:  External genitalia: normal general appearance Urinary system: urethral meatus normal and bladder without fullness, nontender Vaginal: normal without tenderness, induration or masses Cervix: normal appearance Adnexa: normal bimanual exam Uterus: anteverted and non-tender, normal size   Lab Review Urine pregnancy test Labs reviewed yes Radiologic studies reviewed no  I have spent a total of 20 minutes of face-to-face time, excluding clinical staff time, reviewing notes and preparing to see patient, ordering tests and/or medications, and counseling the patient.   Assessment:    1. Encounter for gynecological examination with Papanicolaou smear of cervix (Primary) Rx: - Cytology - PAP( [place])  2. Vaginal discharge Rx: - Cervicovaginal ancillary only( [place]) - metroNIDAZOLE  (METROGEL ) 0.75 % vaginal gel; Place 1 Applicatorful vaginally 2 (two) times daily.  Dispense: 70 g; Refill: 2  3. Encounter for other general counseling and advice on contraception - options discussed.  Declines contracetion. - condoms recommended for STD Prevention     Plan:    Education reviewed: calcium supplements, depression evaluation, low fat, low cholesterol diet, safe sex/STD prevention, self breast exams, and weight bearing exercise. Contraception: none. Follow up in: 1 year.   Meds  ordered this encounter  Medications   metroNIDAZOLE  (METROGEL ) 0.75 % vaginal gel    Sig: Place 1 Applicatorful vaginally 2 (two) times daily.    Dispense:  70 g    Refill:  2     CARLIN RONAL CENTERS, MD, FACOG Attending Obstetrician & Gynecologist, [facility] for [facility], [facility], [place] 09/16/2024

## 2024-09-18 ENCOUNTER — Ambulatory Visit: Payer: Self-pay | Admitting: Obstetrics

## 2024-09-18 LAB — CERVICOVAGINAL ANCILLARY ONLY
Bacterial Vaginitis (gardnerella): POSITIVE — AB
Candida Glabrata: NEGATIVE
Candida Vaginitis: NEGATIVE
Chlamydia: NEGATIVE
Comment: NEGATIVE
Comment: NEGATIVE
Comment: NEGATIVE
Comment: NEGATIVE
Comment: NEGATIVE
Comment: NORMAL
Neisseria Gonorrhea: NEGATIVE
Trichomonas: NEGATIVE

## 2024-09-18 LAB — CYTOLOGY - PAP: Diagnosis: NEGATIVE
